# Patient Record
Sex: Male | Born: 1950 | Race: White | Hispanic: No | Marital: Married | Smoking: Never smoker
Health system: Southern US, Community
[De-identification: ages and names within clinical notes are randomized; demographics above are authoritative.]

## PROBLEM LIST (undated history)

## (undated) DIAGNOSIS — Z87442 Personal history of urinary calculi: Secondary | ICD-10-CM

## (undated) DIAGNOSIS — N200 Calculus of kidney: Secondary | ICD-10-CM

## (undated) HISTORY — PX: KNEE SURGERY: SHX244

---

## 2006-10-29 ENCOUNTER — Ambulatory Visit: Payer: Self-pay | Admitting: Orthopedic Surgery

## 2007-11-02 ENCOUNTER — Emergency Department: Payer: Self-pay | Admitting: Emergency Medicine

## 2007-11-11 ENCOUNTER — Emergency Department: Payer: Self-pay | Admitting: Internal Medicine

## 2010-02-05 DIAGNOSIS — D239 Other benign neoplasm of skin, unspecified: Secondary | ICD-10-CM

## 2010-02-05 HISTORY — DX: Other benign neoplasm of skin, unspecified: D23.9

## 2011-01-23 ENCOUNTER — Ambulatory Visit: Payer: Self-pay | Admitting: Urology

## 2011-01-24 ENCOUNTER — Ambulatory Visit: Payer: Self-pay | Admitting: Urology

## 2011-01-25 ENCOUNTER — Observation Stay: Payer: Self-pay | Admitting: Internal Medicine

## 2011-08-06 ENCOUNTER — Emergency Department: Payer: Self-pay | Admitting: Unknown Physician Specialty

## 2011-08-06 LAB — URINALYSIS, COMPLETE
Bilirubin,UR: NEGATIVE
Hyaline Cast: 6
Ketone: NEGATIVE
Nitrite: NEGATIVE
Ph: 5 (ref 4.5–8.0)
Protein: NEGATIVE
Specific Gravity: 1.023 (ref 1.003–1.030)
WBC UR: 7 /HPF (ref 0–5)

## 2011-08-06 LAB — CBC
HGB: 14.4 g/dL (ref 13.0–18.0)
MCHC: 32.9 g/dL (ref 32.0–36.0)
Platelet: 332 10*3/uL (ref 150–440)
RBC: 5 10*6/uL (ref 4.40–5.90)

## 2011-08-06 LAB — COMPREHENSIVE METABOLIC PANEL
Albumin: 4.3 g/dL (ref 3.4–5.0)
Alkaline Phosphatase: 80 U/L (ref 50–136)
BUN: 21 mg/dL — ABNORMAL HIGH (ref 7–18)
Bilirubin,Total: 0.5 mg/dL (ref 0.2–1.0)
Co2: 27 mmol/L (ref 21–32)
Creatinine: 1.13 mg/dL (ref 0.60–1.30)
EGFR (African American): >60
EGFR (Non-African Amer.): >60
SGOT(AST): 30 U/L (ref 15–37)
SGPT (ALT): 37 U/L
Sodium: 141 mmol/L (ref 136–145)
Total Protein: 7.1 g/dL (ref 6.4–8.2)

## 2011-08-06 LAB — TROPONIN I: Troponin-I: <0.02 ng/mL

## 2013-04-30 DIAGNOSIS — D239 Other benign neoplasm of skin, unspecified: Secondary | ICD-10-CM

## 2013-04-30 HISTORY — DX: Other benign neoplasm of skin, unspecified: D23.9

## 2014-08-02 DIAGNOSIS — C4491 Basal cell carcinoma of skin, unspecified: Secondary | ICD-10-CM

## 2014-08-02 HISTORY — DX: Basal cell carcinoma of skin, unspecified: C44.91

## 2014-10-11 ENCOUNTER — Emergency Department
Admission: EM | Admit: 2014-10-11 | Discharge: 2014-10-11 | Disposition: A | Discharge status: Home / Self Care | Payer: No Typology Code available for payment source | Attending: Emergency Medicine | Admitting: Emergency Medicine

## 2014-10-11 ENCOUNTER — Emergency Department: Payer: No Typology Code available for payment source

## 2014-10-11 ENCOUNTER — Encounter: Payer: Self-pay | Admitting: *Deleted

## 2014-10-11 DIAGNOSIS — R109 Unspecified abdominal pain: Secondary | ICD-10-CM | POA: Diagnosis present

## 2014-10-11 DIAGNOSIS — N201 Calculus of ureter: Secondary | ICD-10-CM | POA: Diagnosis not present

## 2014-10-11 LAB — CBC WITH DIFFERENTIAL/PLATELET
BASOS ABS: 0 10*3/uL (ref 0–0.1)
Basophils Relative: 1 %
EOS ABS: 0.1 10*3/uL (ref 0–0.7)
EOS PCT: 1 %
HCT: 47.2 % (ref 40.0–52.0)
Hemoglobin: 15.7 g/dL (ref 13.0–18.0)
Lymphocytes Relative: 8 %
Lymphs Abs: 0.9 10*3/uL — ABNORMAL LOW (ref 1.0–3.6)
MCH: 29.2 pg (ref 26.0–34.0)
MCHC: 33.3 g/dL (ref 32.0–36.0)
MCV: 87.8 fL (ref 80.0–100.0)
Monocytes Absolute: 0.8 10*3/uL (ref 0.2–1.0)
Monocytes Relative: 8 %
Neutro Abs: 8.8 10*3/uL — ABNORMAL HIGH (ref 1.4–6.5)
Neutrophils Relative %: 82 %
PLATELETS: 302 10*3/uL (ref 150–440)
RBC: 5.38 MIL/uL (ref 4.40–5.90)
RDW: 14 % (ref 11.5–14.5)
WBC: 10.6 10*3/uL (ref 3.8–10.6)

## 2014-10-11 LAB — URINALYSIS COMPLETE WITH MICROSCOPIC (ARMC ONLY)
BILIRUBIN URINE: NEGATIVE
Bacteria, UA: NONE SEEN
Glucose, UA: NEGATIVE mg/dL
Ketones, ur: NEGATIVE mg/dL
Leukocytes, UA: NEGATIVE
NITRITE: NEGATIVE
PROTEIN: NEGATIVE mg/dL
SPECIFIC GRAVITY, URINE: 1.018 (ref 1.005–1.030)
pH: 5 (ref 5.0–8.0)

## 2014-10-11 LAB — BASIC METABOLIC PANEL
Anion gap: 9 (ref 5–15)
BUN: 24 mg/dL — AB (ref 6–20)
CO2: 24 mmol/L (ref 22–32)
Calcium: 9.3 mg/dL (ref 8.9–10.3)
Chloride: 104 mmol/L (ref 101–111)
Creatinine, Ser: 1.31 mg/dL — ABNORMAL HIGH (ref 0.61–1.24)
GFR calc Af Amer: >60 mL/min (ref >60)
GFR, EST NON AFRICAN AMERICAN: 57 mL/min — AB (ref >60)
Glucose, Bld: 93 mg/dL (ref 65–99)
POTASSIUM: 4.3 mmol/L (ref 3.5–5.1)
Sodium: 137 mmol/L (ref 135–145)

## 2014-10-11 MED ORDER — KETOROLAC TROMETHAMINE 60 MG/2ML IM SOLN
60.0000 mg | Freq: Once | INTRAMUSCULAR | Status: AC
  Administered 2014-10-11: 60 mg via INTRAMUSCULAR
  Filled 2014-10-11: qty 2

## 2014-10-11 MED ORDER — KETOROLAC TROMETHAMINE 10 MG PO TABS: 10.0000 mg | ORAL_TABLET | Freq: Three times a day (TID) | ORAL | Status: DC | PRN

## 2014-10-11 MED ORDER — OXYCODONE-ACETAMINOPHEN 5-325 MG PO TABS: 1.0000 | ORAL_TABLET | ORAL | Status: DC | PRN

## 2014-10-11 MED ORDER — TAMSULOSIN HCL 0.4 MG PO CAPS: 0.4000 mg | ORAL_CAPSULE | Freq: Every day | ORAL | Status: DC

## 2014-10-11 MED ORDER — ONDANSETRON HCL 4 MG PO TABS: 4.0000 mg | ORAL_TABLET | Freq: Three times a day (TID) | ORAL | Status: DC | PRN

## 2014-10-11 NOTE — ED Provider Notes (Signed)
[facility] Emergency Department Provider Note   ____________________________________________  Time seen: 2:15 PM I have reviewed the triage vital signs and the triage nursing note.  HISTORY  Chief Complaint Flank Pain   Historian Patient  [name] is a 64 y.o. male who has a history of prior kidney stones, who started experiencing intermittent left flank pain last week. Pain is moderate to severe. Currently his pain is 7 out of 10. This feels a prior kidney stone. He's had no hematuria noted. No fever. Moderate nausea. No nausea currently. He's never needed surgical intervention for prior kidney stones.    History reviewed. No pertinent past medical history. Kidney stones  There are no active problems to display for this patient.   History reviewed. No pertinent past surgical history.  Current Outpatient Rx  Name  Route  Sig  Dispense  Refill  . ketorolac (TORADOL) 10 MG tablet   Oral   Take 1 tablet (10 mg total) by mouth every 8 (eight) hours as needed for moderate pain.   15 tablet   0   . ondansetron (ZOFRAN) 4 MG tablet   Oral   Take 1 tablet (4 mg total) by mouth every 8 (eight) hours as needed for nausea or vomiting.   10 tablet   0   . oxyCODONE-acetaminophen (ROXICET) 5-325 MG per tablet   Oral   Take 1 tablet by mouth every 4 (four) hours as needed for severe pain.   10 tablet   0   . tamsulosin (FLOMAX) 0.4 MG CAPS capsule   Oral   Take 1 capsule (0.4 mg total) by mouth daily.   7 capsule   0     Allergies Ciprofloxacin  No family history on file.  Social History Social History  Substance Use Topics  . Smoking status: Never Smoker   . Smokeless tobacco: None  . Alcohol Use: No    Review of Systems  Constitutional: Negative for fever. Eyes: Negative for visual changes. ENT: Negative for sore throat. Cardiovascular: Negative for chest pain. Respiratory: Negative for shortness of  breath. Gastrointestinal: Negative for abdominal pain, vomiting and diarrhea. Genitourinary: Negative for dysuria. Musculoskeletal: Negative for back pain. Skin: Negative for rash. Neurological: Negative for headache. 10 point Review of Systems otherwise negative ____________________________________________   PHYSICAL EXAM:  VITAL SIGNS: ED Triage Vitals  Enc Vitals Group     BP 10/11/14 1143 156/87 mmHg     Pulse Rate 10/11/14 1143 53     Resp 10/11/14 1143 20     Temp 10/11/14 1143 98.2 F (36.8 C)     Temp Source 10/11/14 1143 Oral     SpO2 10/11/14 1143 97 %     Weight 10/11/14 1143 225 lb (102.059 kg)     Height 10/11/14 1143 6\' 3"  (1.905 m)     Head Cir --      Peak Flow --      Pain Score 10/11/14 1144 8     Pain Loc --      Pain Edu? --      Excl. in [place]? --      Constitutional: Alert and oriented. Well appearing and in no distress. Eyes: Conjunctivae are normal. PERRL. Normal extraocular movements. ENT   Head: Normocephalic and atraumatic.   Nose: No congestion/rhinnorhea.   Mouth/Throat: Mucous membranes are moist.   Neck: No stridor. Cardiovascular/Chest: Normal rate, regular rhythm.  No murmurs, rubs, or gallops. Respiratory: Normal respiratory effort without tachypnea nor  retractions. Breath sounds are clear and equal bilaterally. No wheezes/rales/rhonchi. Gastrointestinal: Soft. No distention, no guarding, no rebound. Nontender   Genitourinary/rectal:Deferred Musculoskeletal: Nontender with normal range of motion in all extremities. No joint effusions.  No lower extremity tenderness.  No edema. Neurologic:  Normal speech and language. No gross or focal neurologic deficits are appreciated. Skin:  Skin is warm, dry and intact. No rash noted. Psychiatric: Mood and affect are normal. Speech and behavior are normal. Patient exhibits appropriate insight and judgment.  ____________________________________________   EKG I, Lisa Roca, MD, the  attending physician have personally viewed and interpreted all ECGs.  None ____________________________________________  LABS (pertinent positives/negatives)  Basic metabolic panel significant for BUN 24 and creatinine 1.31 CBC shows white blood cell count 10.6 with a mild left shift otherwise within normal limits Urinalysis too numerous to count red blood cells and all else negative  ____________________________________________  RADIOLOGY All Xrays were viewed by me. Imaging interpreted by Radiologist.  Ultrasound renal:  IMPRESSION: 1. Mild left hydronephrosis. No definite stones identified. Consider CT as needed to evaluate for intrarenal or ureteral stones. 2. Benign right renal cyst. __________________________________________  PROCEDURES  Procedure(s) performed: None  Critical Care performed: None  ____________________________________________   ED COURSE / ASSESSMENT AND PLAN  CONSULTATIONS: None  Pertinent labs & imaging results that were available during my care of the patient were reviewed by me and considered in my medical decision making (see chart for details).   Clinically patient's symptoms are suspicious for ureterolithiasis. Urinalysis is positive for hematuria, and his ultrasound shows mild hydro-on the left which is the same status is critical symptoms. I discussed with patient not obtaining a CT today. We'll treat presumptively for ureterolithiasis.  Patient / Family / Caregiver informed of clinical course, medical decision-making process, and agree with plan.   I discussed return precautions, follow-up instructions, and discharged instructions with patient and/or family.  ___________________________________________   FINAL CLINICAL IMPRESSION(S) / ED DIAGNOSES   Final diagnoses:  Left flank pain  Ureterolithiasis       Lisa Roca, MD 10/11/14 1623

## 2014-10-11 NOTE — Discharge Instructions (Signed)
You were evaluated for left flank pain and found to have blood in your urine consistent with a kidney stone clinically. Return to the emergency department for any worsening pain, inability to urinate, fever, nausea, dizziness or passing out, abdominal pain, or any other symptoms concerning to you.  If you have not pass the stone within 1 week, make an appointment with a urologist.   Kidney Stones Kidney stones (urolithiasis) are deposits that form inside your kidneys. The intense pain is caused by the stone moving through the urinary tract. When the stone moves, the ureter goes into spasm around the stone. The stone is usually passed in the urine.  CAUSES   A disorder that makes certain neck glands produce too much parathyroid hormone (primary hyperparathyroidism).  A buildup of uric acid crystals, similar to gout in your joints.  Narrowing (stricture) of the ureter.  A kidney obstruction present at birth (congenital obstruction).  Previous surgery on the kidney or ureters.  Numerous kidney infections. SYMPTOMS   Feeling sick to your stomach (nauseous).  Throwing up (vomiting).  Blood in the urine (hematuria).  Pain that usually spreads (radiates) to the groin.  Frequency or urgency of urination. DIAGNOSIS   Taking a history and physical exam.  Blood or urine tests.  CT scan.  Occasionally, an examination of the inside of the urinary bladder (cystoscopy) is performed. TREATMENT   Observation.  Increasing your fluid intake.  Extracorporeal shock wave lithotripsy--This is a noninvasive procedure that uses shock waves to break up kidney stones.  Surgery may be needed if you have severe pain or persistent obstruction. There are various surgical procedures. Most of the procedures are performed with the use of small instruments. Only small incisions are needed to accommodate these instruments, so recovery time is minimized. The size, location, and chemical composition are  all important variables that will determine the proper choice of action for you. Talk to your health care provider to better understand your situation so that you will minimize the risk of injury to yourself and your kidney.  HOME CARE INSTRUCTIONS   Drink enough water and fluids to keep your urine clear or pale yellow. This will help you to pass the stone or stone fragments.  Strain all urine through the provided strainer. Keep all particulate matter and stones for your health care provider to see. The stone causing the pain may be as small as a grain of salt. It is very important to use the strainer each and every time you pass your urine. The collection of your stone will allow your health care provider to analyze it and verify that a stone has actually passed. The stone analysis will often identify what you can do to reduce the incidence of recurrences.  Only take over-the-counter or prescription medicines for pain, discomfort, or fever as directed by your health care provider.  Make a follow-up appointment with your health care provider as directed.  Get follow-up X-rays if required. The absence of pain does not always mean that the stone has passed. It may have only stopped moving. If the urine remains completely obstructed, it can cause loss of kidney function or even complete destruction of the kidney. It is your responsibility to make sure X-rays and follow-ups are completed. Ultrasounds of the kidney can show blockages and the status of the kidney. Ultrasounds are not associated with any radiation and can be performed easily in a matter of minutes. SEEK MEDICAL CARE IF:  You experience pain that is progressive  and unresponsive to any pain medicine you have been prescribed. SEEK IMMEDIATE MEDICAL CARE IF:   Pain cannot be controlled with the prescribed medicine.  You have a fever or shaking chills.  The severity or intensity of pain increases over 18 hours and is not relieved by pain  medicine.  You develop a new onset of abdominal pain.  You feel faint or pass out.  You are unable to urinate. MAKE SURE YOU:   Understand these instructions.  Will watch your condition.  Will get help right away if you are not doing well or get worse. Document Released: 01/22/2005 Document Revised: 09/24/2012 Document Reviewed: 06/25/2012 [facility] Patient Information 2015 [place], [place]. This information is not intended to replace advice given to you by your health care provider. Make sure you discuss any questions you have with your health care provider.

## 2014-10-11 NOTE — ED Notes (Signed)
Pt has a history of kidney stones , pt complains of left flank pain, nausea

## 2014-10-11 NOTE — ED Notes (Signed)
Patient transported to Ultrasound 

## 2014-10-11 NOTE — ED Notes (Signed)
Pt reports kidney stones, pain since Friday worsening this am. Pt reports pain is on the left side. Reports hx of the same, feels the same. C/o nausea as well. Denies blood in urine or inability to urinate.

## 2015-07-27 ENCOUNTER — Emergency Department
Admission: EM | Admit: 2015-07-27 | Discharge: 2015-07-27 | Disposition: A | Discharge status: Home / Self Care | Payer: BLUE CROSS/BLUE SHIELD | Attending: Emergency Medicine | Admitting: Emergency Medicine

## 2015-07-27 ENCOUNTER — Encounter: Payer: Self-pay | Admitting: Emergency Medicine

## 2015-07-27 ENCOUNTER — Emergency Department: Payer: BLUE CROSS/BLUE SHIELD

## 2015-07-27 DIAGNOSIS — R109 Unspecified abdominal pain: Secondary | ICD-10-CM

## 2015-07-27 DIAGNOSIS — N201 Calculus of ureter: Secondary | ICD-10-CM | POA: Diagnosis not present

## 2015-07-27 DIAGNOSIS — R319 Hematuria, unspecified: Secondary | ICD-10-CM

## 2015-07-27 DIAGNOSIS — R1031 Right lower quadrant pain: Secondary | ICD-10-CM | POA: Diagnosis present

## 2015-07-27 HISTORY — DX: Calculus of kidney: N20.0

## 2015-07-27 LAB — URINALYSIS COMPLETE WITH MICROSCOPIC (ARMC ONLY)
Bilirubin Urine: NEGATIVE
Glucose, UA: NEGATIVE mg/dL
KETONES UR: NEGATIVE mg/dL
Leukocytes, UA: NEGATIVE
Nitrite: NEGATIVE
PH: 5 (ref 5.0–8.0)
PROTEIN: NEGATIVE mg/dL
SQUAMOUS EPITHELIAL / LPF: NONE SEEN
Specific Gravity, Urine: 1.019 (ref 1.005–1.030)

## 2015-07-27 LAB — BASIC METABOLIC PANEL
ANION GAP: 8 (ref 5–15)
BUN: 23 mg/dL — ABNORMAL HIGH (ref 6–20)
CALCIUM: 9.6 mg/dL (ref 8.9–10.3)
CO2: 23 mmol/L (ref 22–32)
Chloride: 108 mmol/L (ref 101–111)
Creatinine, Ser: 1.36 mg/dL — ABNORMAL HIGH (ref 0.61–1.24)
GFR, EST NON AFRICAN AMERICAN: 54 mL/min — AB (ref >60)
Glucose, Bld: 107 mg/dL — ABNORMAL HIGH (ref 65–99)
Potassium: 4.1 mmol/L (ref 3.5–5.1)
Sodium: 139 mmol/L (ref 135–145)

## 2015-07-27 LAB — CBC
HCT: 44.2 % (ref 40.0–52.0)
HEMOGLOBIN: 15.2 g/dL (ref 13.0–18.0)
MCH: 29.1 pg (ref 26.0–34.0)
MCHC: 34.3 g/dL (ref 32.0–36.0)
MCV: 84.7 fL (ref 80.0–100.0)
Platelets: 334 10*3/uL (ref 150–440)
RBC: 5.22 MIL/uL (ref 4.40–5.90)
RDW: 14.9 % — ABNORMAL HIGH (ref 11.5–14.5)
WBC: 9.9 10*3/uL (ref 3.8–10.6)

## 2015-07-27 MED ORDER — TAMSULOSIN HCL 0.4 MG PO CAPS: 0.4000 mg | ORAL_CAPSULE | Freq: Every day | ORAL | Status: DC

## 2015-07-27 MED ORDER — ONDANSETRON 4 MG PO TBDP
8.0000 mg | ORAL_TABLET | Freq: Once | ORAL | Status: AC
  Administered 2015-07-27: 8 mg via ORAL
  Filled 2015-07-27: qty 2

## 2015-07-27 MED ORDER — OXYCODONE-ACETAMINOPHEN 5-325 MG PO TABS: 1.0000 | ORAL_TABLET | Freq: Four times a day (QID) | ORAL | Status: DC | PRN

## 2015-07-27 MED ORDER — ONDANSETRON 4 MG PO TBDP: 4.0000 mg | ORAL_TABLET | Freq: Three times a day (TID) | ORAL | Status: DC | PRN

## 2015-07-27 MED ORDER — KETOROLAC TROMETHAMINE 30 MG/ML IJ SOLN
15.0000 mg | Freq: Once | INTRAMUSCULAR | Status: AC
  Administered 2015-07-27: 15 mg via INTRAMUSCULAR
  Filled 2015-07-27: qty 1

## 2015-07-27 MED ORDER — NAPROXEN 500 MG PO TABS: 500.0000 mg | ORAL_TABLET | Freq: Two times a day (BID) | ORAL | Status: DC

## 2015-07-27 NOTE — ED Provider Notes (Signed)
[facility] Emergency Department Provider Note  ____________________________________________  Time seen: 3:10 PM  I have reviewed the triage vital signs and the nursing notes.   HISTORY  Chief Complaint Flank Pain    [name] is a 65 y.o. male who complains of severe right flank pain radiating to the right lower quadrant that started earlier today, colicky. Feels like prior kidney stones. No aggravating or alleviating factors. Associated with nausea but no vomiting diarrhea fevers or chills. No chest pain or shortness of breath.     Past Medical History  Diagnosis Date  . Kidney stones      There are no active problems to display for this patient.    Past Surgical History  Procedure Laterality Date  . Knee surgery       Current Outpatient Rx  Name  Route  Sig  Dispense  Refill  . naproxen (NAPROSYN) 500 MG tablet   Oral   Take 1 tablet (500 mg total) by mouth 2 (two) times daily with a meal.   20 tablet   0   . ondansetron (ZOFRAN ODT) 4 MG disintegrating tablet   Oral   Take 1 tablet (4 mg total) by mouth every 8 (eight) hours as needed for nausea or vomiting.   20 tablet   0   . oxyCODONE-acetaminophen (ROXICET) 5-325 MG tablet   Oral   Take 1 tablet by mouth every 6 (six) hours as needed for severe pain.   12 tablet   0   . tamsulosin (FLOMAX) 0.4 MG CAPS capsule   Oral   Take 1 capsule (0.4 mg total) by mouth daily.   30 capsule   0      Allergies Ciprofloxacin   No family history on file.  Social History Social History  Substance Use Topics  . Smoking status: Never Smoker   . Smokeless tobacco: None  . Alcohol Use: No    Review of Systems  Constitutional:   No fever or chills.  Eyes:   No vision changes.  ENT:   No sore throat. No rhinorrhea. Cardiovascular:   No chest pain. Respiratory:   No dyspnea or cough. Gastrointestinal:   Right flank pain without vomiting or diarrhea.   Genitourinary:   Negative for dysuria or difficulty urinating. Musculoskeletal:   Negative for focal pain or swelling Neurological:   Negative for headaches 10-point ROS otherwise negative.  ____________________________________________   PHYSICAL EXAM:  VITAL SIGNS: ED Triage Vitals  Enc Vitals Group     BP 07/27/15 1355 162/76 mmHg     Pulse Rate 07/27/15 1355 50     Resp 07/27/15 1355 18     Temp 07/27/15 1355 97.8 F (36.6 C)     Temp Source 07/27/15 1355 Oral     SpO2 07/27/15 1355 97 %     Weight 07/27/15 1355 250 lb (113.399 kg)     Height 07/27/15 1355 6\' 3"  (1.905 m)     Head Cir --      Peak Flow --      Pain Score 07/27/15 1401 8     Pain Loc --      Pain Edu? --      Excl. in [place]? --     Vital signs reviewed, nursing assessments reviewed.   Constitutional:   Alert and oriented. Well appearing and in no distress. Eyes:   No scleral icterus. No conjunctival pallor. PERRL. EOMI.  No nystagmus. ENT   Head:  Normocephalic and atraumatic.   Nose:   No congestion/rhinnorhea. No septal hematoma   Mouth/Throat:   MMM, no pharyngeal erythema. No peritonsillar mass.    Neck:   No stridor. No SubQ emphysema. No meningismus. Hematological/Lymphatic/Immunilogical:   No cervical lymphadenopathy. Cardiovascular:   RRR. Symmetric bilateral radial and DP pulses.  No murmurs.  Respiratory:   Normal respiratory effort without tachypnea nor retractions. Breath sounds are clear and equal bilaterally. No wheezes/rales/rhonchi. Gastrointestinal:   Soft With right lower quadrant tenderness. Non distended. There is Right CVA tenderness.  No rebound, rigidity, or guarding. Genitourinary:   deferred Musculoskeletal:   Nontender with normal range of motion in all extremities. No joint effusions.  No lower extremity tenderness.  No edema. Neurologic:   Normal speech and language.  CN 2-10 normal. Motor grossly intact. No gross focal neurologic deficits are appreciated.   Skin:    Skin is warm, dry and intact. No rash noted.  No petechiae, purpura, or bullae.  ____________________________________________    LABS (pertinent positives/negatives) (all labs ordered are listed, but only abnormal results are displayed) Labs Reviewed  URINALYSIS COMPLETEWITH MICROSCOPIC ([place]) - Abnormal; Notable for the following:    Color, Urine YELLOW (*)    APPearance CLEAR (*)    Hgb urine dipstick 3+ (*)    Bacteria, UA RARE (*)    All other components within normal limits  BASIC METABOLIC PANEL - Abnormal; Notable for the following:    Glucose, Bld 107 (*)    BUN 23 (*)    Creatinine, Ser 1.36 (*)    GFR calc non Af Amer 54 (*)    All other components within normal limits  CBC - Abnormal; Notable for the following:    RDW 14.9 (*)    All other components within normal limits   ____________________________________________   EKG    ____________________________________________    RADIOLOGY  CT abdomen and pelvis reveals right ureterolithiasis 5 mm stone with mild hydronephrosis.  ____________________________________________   PROCEDURES   ____________________________________________   INITIAL IMPRESSION / ASSESSMENT AND PLAN / ED COURSE  Pertinent labs & imaging results that were available during my care of the patient were reviewed by me and considered in my medical decision making (see chart for details).  Patient presents with right flank pain and hematuria, pain control after Toradol and Zofran. CT reveals 5 mm stone in the ureter with some mild nephrolithiasis. Creatinine is at baseline. No evidence of urinary tract infection. Follow-up with primary care and urology. We'll prescribe NSAID Percocet Flomax and Zofran.     ____________________________________________   FINAL CLINICAL IMPRESSION(S) / ED DIAGNOSES  Final diagnoses:  Ureterolithiasis  Hematuria  Flank pain       Portions of this note were generated with dragon  dictation software. Dictation errors may occur despite best attempts at proofreading.   Carrie Mew, MD 07/27/15 1729

## 2015-07-27 NOTE — ED Notes (Signed)
Pt with right flank pain that started earlier today.  Pt with history of kidney stones and states "this feels like kidney stone".  Pt with some nausea.

## 2015-07-27 NOTE — Discharge Instructions (Signed)
You were prescribed a medication that is potentially sedating. Do not drink alcohol, drive or participate in any other potentially dangerous activities while taking this medication as it may make you sleepy. Do not take this medication with any other sedating medications, either prescription or over-the-counter. If you were prescribed Percocet or Vicodin, do not take these with acetaminophen (Tylenol) as it is already contained within these medications.   Opioid pain medications (or "narcotics") can be habit forming.  Use it as little as possible to achieve adequate pain control.  Do not use or use it with extreme caution if you have a history of opiate abuse or dependence.  If you are on a pain contract with your primary care doctor or a pain specialist, be sure to let them know you were prescribed this medication today from the [facility].  This medication is intended for your use only - do not give any to anyone else and keep it in a secure place where nobody else, especially children and pets, have access to it.  It will also cause or worsen constipation, so you may want to consider taking an over-the-counter stool softener while you are taking this medication.  Abdominal Pain, Adult Many things can cause abdominal pain. Usually, abdominal pain is not caused by a disease and will improve without treatment. It can often be observed and treated at home. Your health care provider will do a physical exam and possibly order blood tests and X-rays to help determine the seriousness of your pain. However, in many cases, more time must pass before a clear cause of the pain can be found. Before that point, your health care provider may not know if you need more testing or further treatment. HOME CARE INSTRUCTIONS Monitor your abdominal pain for any changes. The following actions may help to alleviate any discomfort you are experiencing:  Only take over-the-counter or prescription  medicines as directed by your health care provider.  Do not take laxatives unless directed to do so by your health care provider.  Try a clear liquid diet (broth, tea, or water) as directed by your health care provider. Slowly move to a bland diet as tolerated. SEEK MEDICAL CARE IF:  You have unexplained abdominal pain.  You have abdominal pain associated with nausea or diarrhea.  You have pain when you urinate or have a bowel movement.  You experience abdominal pain that wakes you in the night.  You have abdominal pain that is worsened or improved by eating food.  You have abdominal pain that is worsened with eating fatty foods.  You have a fever. SEEK IMMEDIATE MEDICAL CARE IF:  Your pain does not go away within 2 hours.  You keep throwing up (vomiting).  Your pain is felt only in portions of the abdomen, such as the right side or the left lower portion of the abdomen.  You pass bloody or black tarry stools. MAKE SURE YOU:  Understand these instructions.  Will watch your condition.  Will get help right away if you are not doing well or get worse.   This information is not intended to replace advice given to you by your health care provider. Make sure you discuss any questions you have with your health care provider.   Document Released: 11/01/2004 Document Revised: 10/13/2014 Document Reviewed: 10/01/2012 Elsevier Interactive Patient Education 2016 [employer]  Hematuria, Adult Hematuria is blood in your urine. It can be caused by a bladder infection, kidney infection, prostate infection, kidney  stone, or cancer of your urinary tract. Infections can usually be treated with medicine, and a kidney stone usually will pass through your urine. If neither of these is the cause of your hematuria, further workup to find out the reason may be needed. It is very important that you tell your health care provider about any blood you see in your urine, even if the blood stops  without treatment or happens without causing pain. Blood in your urine that happens and then stops and then happens again can be a symptom of a very serious condition. Also, pain is not a symptom in the initial stages of many urinary cancers. HOME CARE INSTRUCTIONS   Drink lots of fluid, 3-4 quarts a day. If you have been diagnosed with an infection, cranberry juice is especially recommended, in addition to large amounts of water.  Avoid caffeine, tea, and carbonated beverages because they tend to irritate the bladder.  Avoid alcohol because it may irritate the prostate.  Take all medicines as directed by your health care provider.  If you were prescribed an antibiotic medicine, finish it all even if you start to feel better.  If you have been diagnosed with a kidney stone, follow your health care provider's instructions regarding straining your urine to catch the stone.  Empty your bladder often. Avoid holding urine for long periods of time.  After a bowel movement, women should cleanse front to back. Use each tissue only once.  Empty your bladder before and after sexual intercourse if you are a male. SEEK MEDICAL CARE IF:  You develop back pain.  You have a fever.  You have a feeling of sickness in your stomach (nausea) or vomiting.  Your symptoms are not better in 3 days. Return sooner if you are getting worse. SEEK IMMEDIATE MEDICAL CARE IF:   You develop severe vomiting and are unable to keep the medicine down.  You develop severe back or abdominal pain despite taking your medicines.  You begin passing a large amount of blood or clots in your urine.  You feel extremely weak or faint, or you pass out. MAKE SURE YOU:   Understand these instructions.  Will watch your condition.  Will get help right away if you are not doing well or get worse.   This information is not intended to replace advice given to you by your health care provider. Make sure you discuss any  questions you have with your health care provider.   Document Released: 01/22/2005 Document Revised: 02/12/2014 Document Reviewed: 09/22/2012 Elsevier Interactive Patient Education 2016 [employer].  Kidney Stones Kidney stones (urolithiasis) are deposits that form inside your kidneys. The intense pain is caused by the stone moving through the urinary tract. When the stone moves, the ureter goes into spasm around the stone. The stone is usually passed in the urine.  CAUSES   A disorder that makes certain neck glands produce too much parathyroid hormone (primary hyperparathyroidism).  A buildup of uric acid crystals, similar to gout in your joints.  Narrowing (stricture) of the ureter.  A kidney obstruction present at birth (congenital obstruction).  Previous surgery on the kidney or ureters.  Numerous kidney infections. SYMPTOMS   Feeling sick to your stomach (nauseous).  Throwing up (vomiting).  Blood in the urine (hematuria).  Pain that usually spreads (radiates) to the groin.  Frequency or urgency of urination. DIAGNOSIS   Taking a history and physical exam.  Blood or urine tests.  CT scan.  Occasionally, an examination  of the inside of the urinary bladder (cystoscopy) is performed. TREATMENT   Observation.  Increasing your fluid intake.  Extracorporeal shock wave lithotripsy--This is a noninvasive procedure that uses shock waves to break up kidney stones.  Surgery may be needed if you have severe pain or persistent obstruction. There are various surgical procedures. Most of the procedures are performed with the use of small instruments. Only small incisions are needed to accommodate these instruments, so recovery time is minimized. The size, location, and chemical composition are all important variables that will determine the proper choice of action for you. Talk to your health care provider to better understand your situation so that you will minimize the risk  of injury to yourself and your kidney.  HOME CARE INSTRUCTIONS   Drink enough water and fluids to keep your urine clear or pale yellow. This will help you to pass the stone or stone fragments.  Strain all urine through the provided strainer. Keep all particulate matter and stones for your health care provider to see. The stone causing the pain may be as small as a grain of salt. It is very important to use the strainer each and every time you pass your urine. The collection of your stone will allow your health care provider to analyze it and verify that a stone has actually passed. The stone analysis will often identify what you can do to reduce the incidence of recurrences.  Only take over-the-counter or prescription medicines for pain, discomfort, or fever as directed by your health care provider.  Keep all follow-up visits as told by your health care provider. This is important.  Get follow-up X-rays if required. The absence of pain does not always mean that the stone has passed. It may have only stopped moving. If the urine remains completely obstructed, it can cause loss of kidney function or even complete destruction of the kidney. It is your responsibility to make sure X-rays and follow-ups are completed. Ultrasounds of the kidney can show blockages and the status of the kidney. Ultrasounds are not associated with any radiation and can be performed easily in a matter of minutes.  Make changes to your daily diet as told by your health care provider. You may be told to:  Limit the amount of salt that you eat.  Eat 5 or more servings of fruits and vegetables each day.  Limit the amount of meat, poultry, fish, and eggs that you eat.  Collect a 24-hour urine sample as told by your health care provider.You may need to collect another urine sample every 6-12 months. SEEK MEDICAL CARE IF:  You experience pain that is progressive and unresponsive to any pain medicine you have been  prescribed. SEEK IMMEDIATE MEDICAL CARE IF:   Pain cannot be controlled with the prescribed medicine.  You have a fever or shaking chills.  The severity or intensity of pain increases over 18 hours and is not relieved by pain medicine.  You develop a new onset of abdominal pain.  You feel faint or pass out.  You are unable to urinate.   This information is not intended to replace advice given to you by your health care provider. Make sure you discuss any questions you have with your health care provider.   Document Released: 01/22/2005 Document Revised: 10/13/2014 Document Reviewed: 06/25/2012 Elsevier Interactive Patient Education [employer].

## 2016-09-17 IMAGING — CT CT RENAL STONE PROTOCOL
3 of 4 series · 8 of 46 positions shown, 15 images · non-contrast
Comparison: 10/11/2014 renal ultrasound.  Prior CT of 01/24/2011.

CLINICAL DATA: Right-sided flank pain. History kidney stones.
Nausea. Hematuria.

EXAM:
CT ABDOMEN AND PELVIS WITHOUT CONTRAST
TECHNIQUE: Multidetector CT imaging of the abdomen and pelvis was performed
following the standard protocol without IV contrast.

[Series 4: lung · axial · 0.83mm/px · z∈[-212,-152]mm · 4 of 22 slices shown, 9 images]
[im 5/22  soft-tissue]
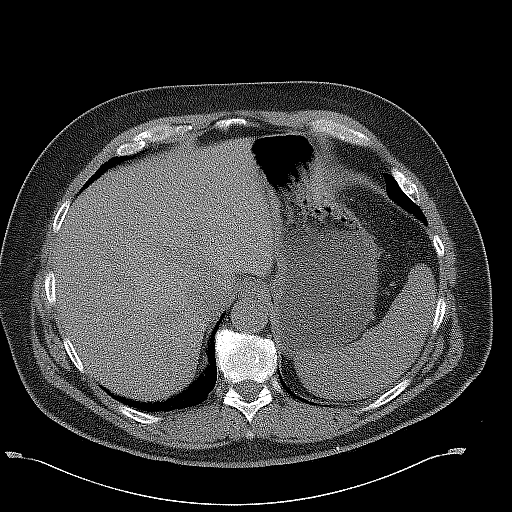
[im 5/22  lung]
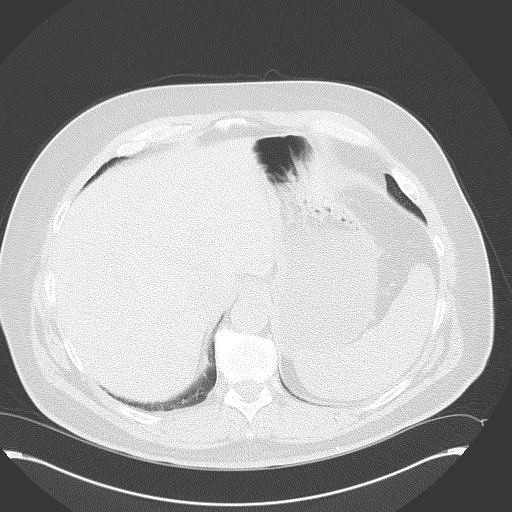
[im 5/22  bone]
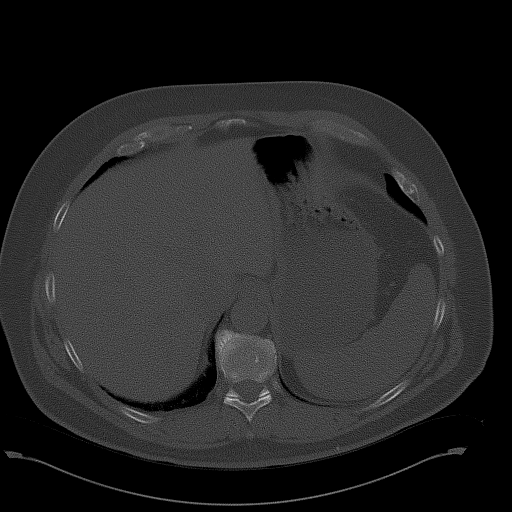
[im 9/22  soft-tissue]
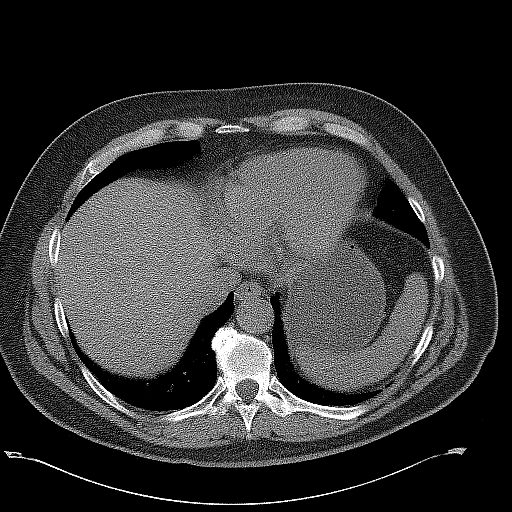
[im 9/22  lung]
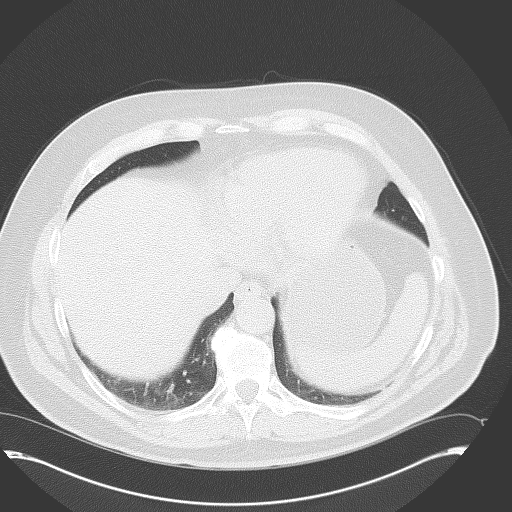
[im 13/22  soft-tissue]
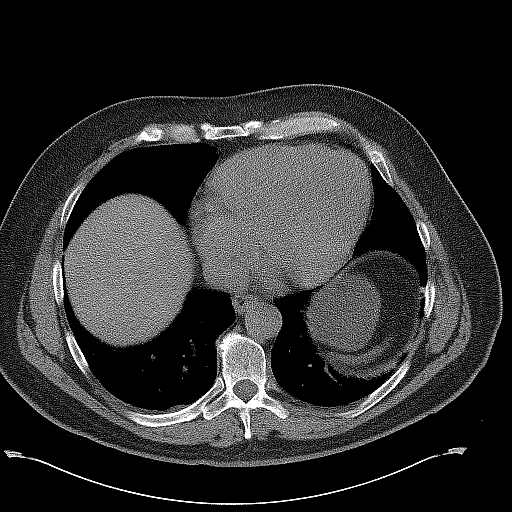
[im 13/22  lung]
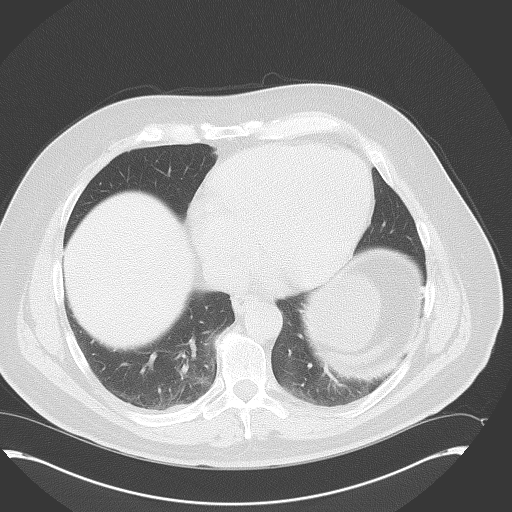
[im 17/22  soft-tissue]
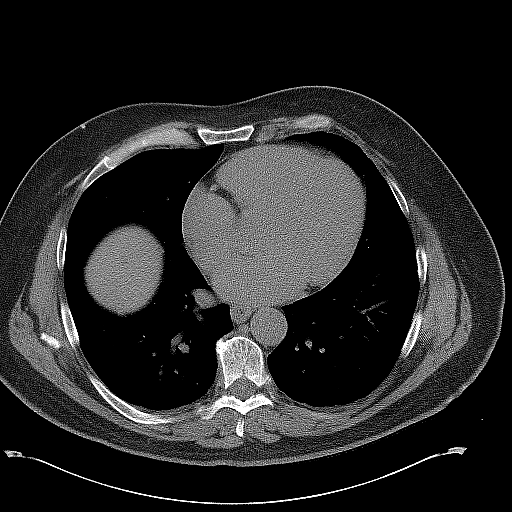
[im 17/22  lung]
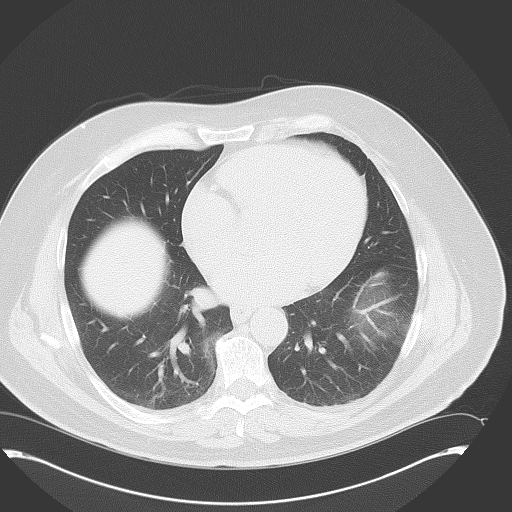

[Series 5: coronal · coronal · 0.83mm/px · 3 of 160 slices shown, 4 images]
[im 54/160  soft-tissue]
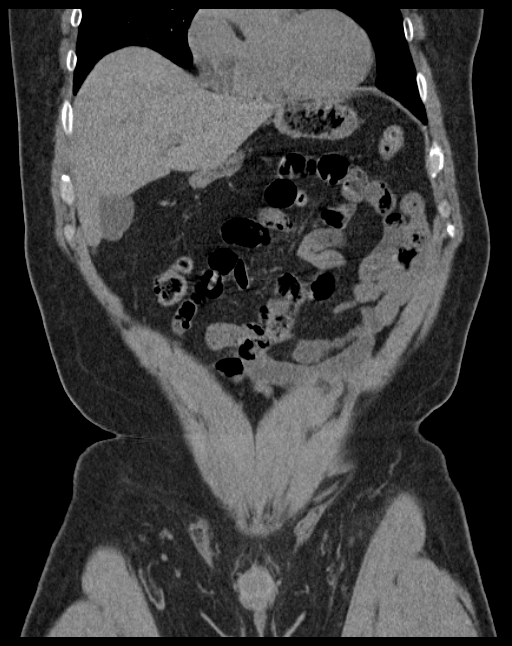
[im 71/160  soft-tissue]
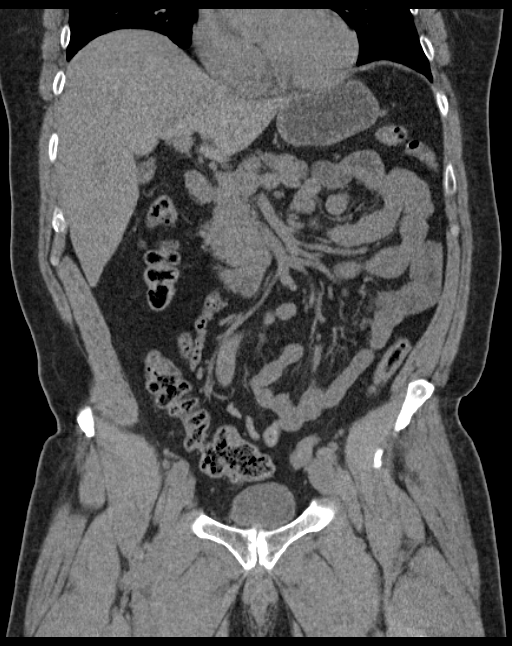
[im 71/160  bone]
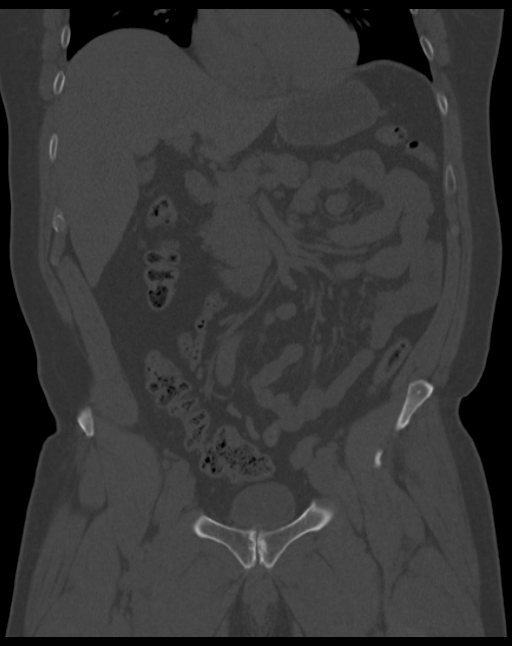
[im 89/160  soft-tissue]
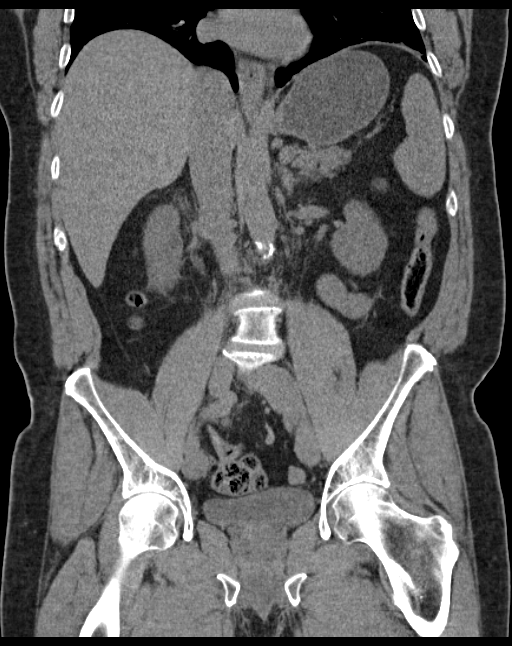

[Series 6: sagittal · sagittal · 0.64mm/px · 1 of 205 slices shown, 2 images]
[im 69/205  soft-tissue]
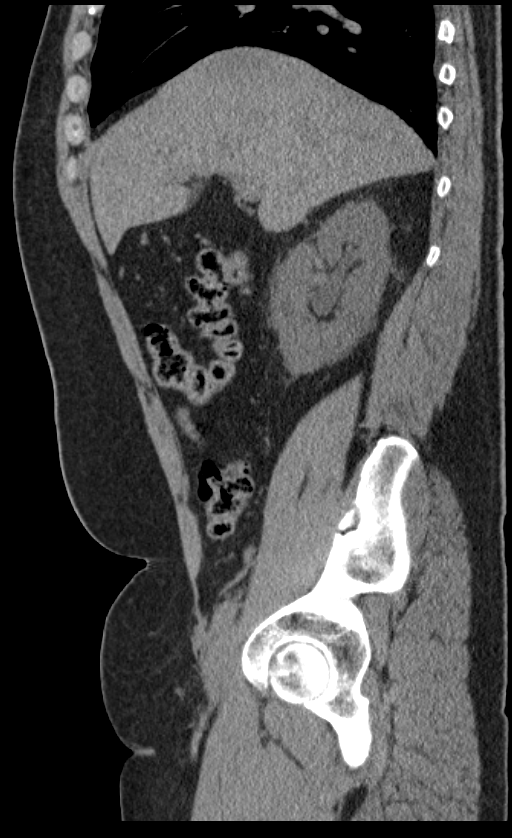
[im 69/205  bone]
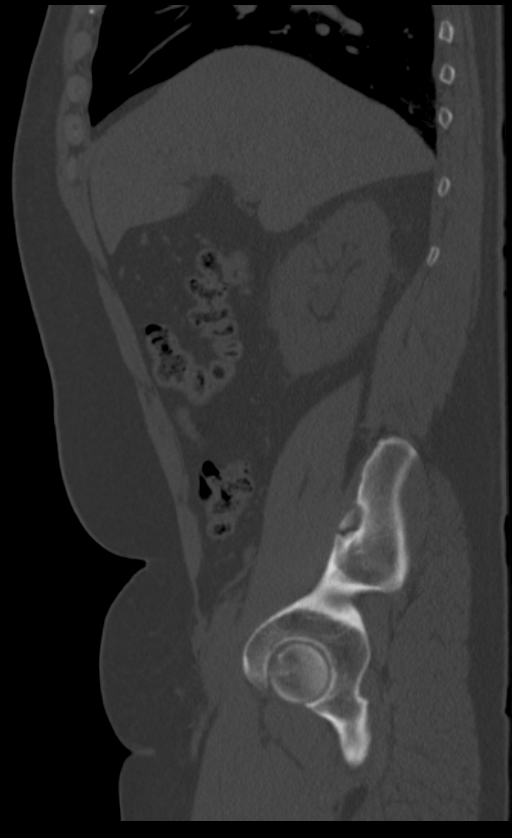

[8 of 46 positions shown; findings below may reference images not displayed]

FINDINGS: Lower chest: Bibasilar dependent atelectasis. Borderline
cardiomegaly, without pericardial or pleural effusion. Lad and right
coronary artery atherosclerosis.

Hepatobiliary: High right hepatic lobe cyst of 9 mm. Suspect small
gallstones. No biliary duct dilatation or acute cholecystitis.

Pancreas: Normal, without mass or ductal dilatation.

Spleen: Normal in size, without focal abnormality.

Adrenals/Urinary Tract: Normal adrenal glands. An interpolar right
renal 14 mm lesion is likely a cyst. 4 mm upper pole right renal
collecting system stone. Mild right-sided hydroureteronephrosis to
the level of a mid to distal right ureteric stone which measures 5
mm. No bladder calculi.

Stomach/Bowel: Normal stomach, without wall thickening. Scattered
colonic diverticula. Normal terminal ileum and appendix. Normal
small bowel.

Vascular/Lymphatic: Aortic and branch vessel atherosclerosis. No
abdominopelvic adenopathy.

Reproductive: Normal prostate.

Other: Trace cul-de-sac fluid

Musculoskeletal: Degenerate disc disease at the lumbosacral
junction.
IMPRESSION: 1. Right-sided urinary tract obstruction secondary to a mid to
distal right ureteric stone.
2. Right nephrolithiasis.
3.  Coronary artery atherosclerosis. Aortic atherosclerosis.
4. Trace cul-de-sac fluid, of indeterminate etiology.

## 2018-07-28 ENCOUNTER — Other Ambulatory Visit: Payer: Self-pay

## 2018-07-28 ENCOUNTER — Other Ambulatory Visit
Admission: RE | Admit: 2018-07-28 | Discharge: 2018-07-28 | Disposition: A | Discharge status: Home / Self Care | Payer: Medicare HMO | Source: Ambulatory Visit | Attending: Internal Medicine | Admitting: Internal Medicine

## 2018-07-28 DIAGNOSIS — Z1159 Encounter for screening for other viral diseases: Secondary | ICD-10-CM | POA: Diagnosis present

## 2018-07-29 LAB — NOVEL CORONAVIRUS, NAA (HOSP ORDER, SEND-OUT TO REF LAB; TAT 18-24 HRS): SARS-CoV-2, NAA: NOT DETECTED

## 2018-07-31 ENCOUNTER — Ambulatory Visit: Payer: Medicare HMO | Admitting: Anesthesiology

## 2018-07-31 ENCOUNTER — Ambulatory Visit
Admission: RE | Admit: 2018-07-31 | Discharge: 2018-07-31 | Disposition: A | Discharge status: Home / Self Care | Payer: Medicare HMO | Attending: Internal Medicine | Admitting: Internal Medicine

## 2018-07-31 ENCOUNTER — Encounter
Admission: RE | Disposition: A | Discharge status: Home / Self Care | Payer: Self-pay | Source: Home / Self Care | Attending: Internal Medicine

## 2018-07-31 ENCOUNTER — Other Ambulatory Visit: Payer: Self-pay

## 2018-07-31 DIAGNOSIS — K219 Gastro-esophageal reflux disease without esophagitis: Secondary | ICD-10-CM | POA: Insufficient documentation

## 2018-07-31 DIAGNOSIS — E785 Hyperlipidemia, unspecified: Secondary | ICD-10-CM | POA: Insufficient documentation

## 2018-07-31 DIAGNOSIS — I480 Paroxysmal atrial fibrillation: Secondary | ICD-10-CM | POA: Diagnosis not present

## 2018-07-31 DIAGNOSIS — Z800 Family history of malignant neoplasm of digestive organs: Secondary | ICD-10-CM | POA: Insufficient documentation

## 2018-07-31 DIAGNOSIS — Z881 Allergy status to other antibiotic agents status: Secondary | ICD-10-CM | POA: Diagnosis not present

## 2018-07-31 DIAGNOSIS — Z87891 Personal history of nicotine dependence: Secondary | ICD-10-CM | POA: Insufficient documentation

## 2018-07-31 DIAGNOSIS — Z86010 Personal history of colonic polyps: Secondary | ICD-10-CM | POA: Insufficient documentation

## 2018-07-31 DIAGNOSIS — I10 Essential (primary) hypertension: Secondary | ICD-10-CM | POA: Diagnosis not present

## 2018-07-31 DIAGNOSIS — Z8042 Family history of malignant neoplasm of prostate: Secondary | ICD-10-CM | POA: Insufficient documentation

## 2018-07-31 DIAGNOSIS — Z79899 Other long term (current) drug therapy: Secondary | ICD-10-CM | POA: Diagnosis not present

## 2018-07-31 DIAGNOSIS — Z8371 Family history of colonic polyps: Secondary | ICD-10-CM | POA: Insufficient documentation

## 2018-07-31 DIAGNOSIS — I4891 Unspecified atrial fibrillation: Secondary | ICD-10-CM | POA: Diagnosis present

## 2018-07-31 DIAGNOSIS — Z8249 Family history of ischemic heart disease and other diseases of the circulatory system: Secondary | ICD-10-CM | POA: Diagnosis not present

## 2018-07-31 HISTORY — DX: Personal history of urinary calculi: Z87.442

## 2018-07-31 HISTORY — PX: CARDIOVERSION: SHX1299

## 2018-07-31 SURGERY — CARDIOVERSION
Anesthesia: General

## 2018-07-31 MED ORDER — PROPOFOL 10 MG/ML IV BOLUS
INTRAVENOUS | Status: DC | PRN
  Administered 2018-07-31: 50 mg via INTRAVENOUS
  Administered 2018-07-31: 30 mg via INTRAVENOUS

## 2018-07-31 MED ORDER — METOPROLOL TARTRATE 25 MG PO TABS: 25.0000 mg | ORAL_TABLET | Freq: Two times a day (BID) | ORAL | 4 refills | Status: DC

## 2018-07-31 MED ORDER — PROPOFOL 500 MG/50ML IV EMUL
INTRAVENOUS | Status: AC
  Filled 2018-07-31: qty 50

## 2018-07-31 MED ORDER — SODIUM CHLORIDE 0.9 % IV SOLN
INTRAVENOUS | Status: DC | PRN
  Administered 2018-07-31: 07:00:00 via INTRAVENOUS

## 2018-07-31 NOTE — Anesthesia Post-op Follow-up Note (Signed)
Anesthesia QCDR form completed.        

## 2018-07-31 NOTE — Transfer of Care (Signed)
Immediate Anesthesia Transfer of Care Note  Patient: Anthony Colon  Procedure(s) Performed: CARDIOVERSION (N/A )  Patient Location: PACU  Anesthesia Type:General  Level of Consciousness: awake and sedated  Airway & Oxygen Therapy: Patient Spontanous Breathing and Patient connected to nasal cannula oxygen  Post-op Assessment: Report given to RN and Post -op Vital signs reviewed and stable  Post vital signs: Reviewed and stable  Last Vitals:  Vitals Value Taken Time  BP    Temp    Pulse 46 07/31/18 0744  Resp 23 07/31/18 0744  SpO2 95 % 07/31/18 0744  Vitals shown include unvalidated device data.  Last Pain:  Vitals:   07/31/18 0648  TempSrc: Oral  PainSc: 0-No pain         Complications: No apparent anesthesia complications

## 2018-07-31 NOTE — Anesthesia Postprocedure Evaluation (Signed)
Anesthesia Post Note  Patient: MOHIT ZIRBES  Procedure(s) Performed: CARDIOVERSION (N/A )  Patient location during evaluation: Cath Lab Anesthesia Type: General Level of consciousness: awake and alert Pain management: pain level controlled Vital Signs Assessment: post-procedure vital signs reviewed and stable Respiratory status: spontaneous breathing, nonlabored ventilation, respiratory function stable and patient connected to nasal cannula oxygen Cardiovascular status: blood pressure returned to baseline and stable Postop Assessment: no apparent nausea or vomiting Anesthetic complications: no     Last Vitals:  Vitals:   07/31/18 0800 07/31/18 0815  BP: 107/65 105/67  Pulse: (!) 46 (!) 43  Resp: (!) 21 16  Temp:    SpO2: 97% 97%    Last Pain:  Vitals:   07/31/18 0648  TempSrc: Oral  PainSc: 0-No pain                 Martha Clan

## 2018-07-31 NOTE — Anesthesia Preprocedure Evaluation (Signed)
Anesthesia Evaluation  Patient identified by MRN, date of birth, ID band Patient awake    Reviewed: Allergy & Precautions, H&P , NPO status , Patient's Chart, lab work & pertinent test results, reviewed documented beta blocker date and time   History of Anesthesia Complications Negative for: history of anesthetic complications  Airway Mallampati: I  TM Distance: >3 FB Neck ROM: full    Dental  (+) Caps, Dental Advidsory Given, Teeth Intact, Chipped   Pulmonary neg pulmonary ROS,           Cardiovascular Exercise Tolerance: Good (-) hypertension(-) angina(-) Past MI + dysrhythmias Atrial Fibrillation (-) Valvular Problems/Murmurs     Neuro/Psych negative neurological ROS  negative psych ROS   GI/Hepatic Neg liver ROS, GERD  ,  Endo/Other  negative endocrine ROS  Renal/GU Renal disease (kidney stones)  negative genitourinary   Musculoskeletal   Abdominal   Peds  Hematology negative hematology ROS (+)   Anesthesia Other Findings Past Medical History: No date: History of kidney stones No date: Kidney stones   Reproductive/Obstetrics negative OB ROS                             Anesthesia Physical Anesthesia Plan  ASA: II  Anesthesia Plan: General   Post-op Pain Management:    Induction: Intravenous  PONV Risk Score and Plan: 2 and Propofol infusion and TIVA  Airway Management Planned: Natural Airway and Nasal Cannula  Additional Equipment:   Intra-op Plan:   Post-operative Plan:   Informed Consent: I have reviewed the patients History and Physical, chart, labs and discussed the procedure including the risks, benefits and alternatives for the proposed anesthesia with the patient or authorized representative who has indicated his/her understanding and acceptance.     Dental Advisory Given  Plan Discussed with: Anesthesiologist, CRNA and Surgeon  Anesthesia Plan Comments:          Anesthesia Quick Evaluation

## 2018-07-31 NOTE — CV Procedure (Signed)
Electrical Cardioversion Procedure Note Anthony Colon [id number] 26-Jan-1951  Procedure: Electrical Cardioversion Indications:  Paroxysmal non valvular atrial fibrillation  Procedure Details Consent: Risks of procedure as well as the alternatives and risks of each were explained to the (patient/caregiver).  Consent for procedure obtained. Time Out: Verified patient identification, verified procedure, site/side was marked, verified correct patient position, special equipment/implants available, medications/allergies/relevent history reviewed, required imaging and test results available.  Performed  Patient placed on cardiac monitor, pulse oximetry, supplemental oxygen as necessary.  Sedation given: Propofol and versed as per anesthesia  Pacer pads placed anterior and posterior chest.  Cardioverted 1 time(s).  Cardioverted at 120J.  Evaluation Findings: Post procedure EKG shows: NSR Complications: None Patient did tolerate procedure well.   [name] M.D. [facility] 07/31/2018, 7:46 AM

## 2019-12-03 ENCOUNTER — Encounter: Payer: Medicare HMO | Admitting: Dermatology

## 2020-03-21 ENCOUNTER — Other Ambulatory Visit: Payer: Self-pay

## 2020-03-21 ENCOUNTER — Ambulatory Visit (INDEPENDENT_AMBULATORY_CARE_PROVIDER_SITE_OTHER): Payer: Medicare HMO | Admitting: Dermatology

## 2020-03-21 DIAGNOSIS — L570 Actinic keratosis: Secondary | ICD-10-CM | POA: Diagnosis not present

## 2020-03-21 DIAGNOSIS — D1800 Hemangioma unspecified site: Secondary | ICD-10-CM

## 2020-03-21 DIAGNOSIS — L820 Inflamed seborrheic keratosis: Secondary | ICD-10-CM

## 2020-03-21 DIAGNOSIS — Z86018 Personal history of other benign neoplasm: Secondary | ICD-10-CM | POA: Diagnosis not present

## 2020-03-21 DIAGNOSIS — Z1283 Encounter for screening for malignant neoplasm of skin: Secondary | ICD-10-CM

## 2020-03-21 DIAGNOSIS — L814 Other melanin hyperpigmentation: Secondary | ICD-10-CM

## 2020-03-21 DIAGNOSIS — Z85828 Personal history of other malignant neoplasm of skin: Secondary | ICD-10-CM

## 2020-03-21 DIAGNOSIS — D229 Melanocytic nevi, unspecified: Secondary | ICD-10-CM

## 2020-03-21 DIAGNOSIS — L578 Other skin changes due to chronic exposure to nonionizing radiation: Secondary | ICD-10-CM | POA: Diagnosis not present

## 2020-03-21 DIAGNOSIS — L821 Other seborrheic keratosis: Secondary | ICD-10-CM

## 2020-03-21 NOTE — Progress Notes (Unsigned)
Follow-Up Visit   Subjective  Anthony Colon is a 70 y.o. male who presents for the following: TBSE (Patient here for full body skin exam and skin cancer screening. Patient with hx of BCC and dysplastic nevus. Nothing new or changing today that patient is aware of. ). The patient presents for Total-Body Skin Exam (TBSE) for skin cancer screening and mole check.  The following portions of the chart were reviewed this encounter and updated as appropriate:   Tobacco  Allergies  Meds  Problems  Med Hx  Surg Hx  Fam Hx     Review of Systems:  No other skin or systemic complaints except as noted in HPI or Assessment and Plan.  Objective  Well appearing patient in no apparent distress; mood and affect are within normal limits.  A full examination was performed including scalp, head, eyes, ears, nose, lips, neck, chest, axillae, abdomen, back, buttocks, bilateral upper extremities, bilateral lower extremities, hands, feet, fingers, toes, fingernails, and toenails. All findings within normal limits unless otherwise noted below.  Objective  Right Hand x 14, right forehead x 2 (16): Erythematous keratotic or waxy stuck-on papule or plaque.   Objective  Left Ear: Erythematous thin papules/macules with gritty scale.    Assessment & Plan  Inflamed seborrheic keratosis (16) Right Hand x 14, right forehead x 2  Destruction of lesion - Right Hand x 14, right forehead x 2 Complexity: simple   Destruction method: cryotherapy   Informed consent: discussed and consent obtained   Timeout:  patient name, date of birth, surgical site, and procedure verified Lesion destroyed using liquid nitrogen: Yes   Region frozen until ice ball extended beyond lesion: Yes   Outcome: patient tolerated procedure well with no complications   Post-procedure details: wound care instructions given    AK (actinic keratosis) Left Ear  Destruction of lesion - Left Ear Complexity: simple   Destruction  method: cryotherapy   Informed consent: discussed and consent obtained   Timeout:  patient name, date of birth, surgical site, and procedure verified Lesion destroyed using liquid nitrogen: Yes   Region frozen until ice ball extended beyond lesion: Yes   Outcome: patient tolerated procedure well with no complications   Post-procedure details: wound care instructions given     Lentigines - Scattered tan macules - Discussed due to sun exposure - Benign, observe - Call for any changes  Seborrheic Keratoses - Stuck-on, waxy, tan-brown papules and plaques  - Discussed benign etiology and prognosis. - Observe - Call for any changes  Melanocytic Nevi - Tan-brown and/or pink-flesh-colored symmetric macules and papules - Benign appearing on exam today - Observation - Call clinic for new or changing moles - Recommend daily use of broad spectrum spf 30+ sunscreen to sun-exposed areas.   Hemangiomas - Red papules - Discussed benign nature - Observe - Call for any changes  Actinic Damage - Chronic, secondary to cumulative UV/sun exposure - diffuse scaly erythematous macules with underlying dyspigmentation - Recommend daily broad spectrum sunscreen SPF 30+ to sun-exposed areas, reapply every 2 hours as needed.  - Call for new or changing lesions.  Skin cancer screening performed today.  History of Dysplastic Nevi - No evidence of recurrence today - Recommend regular full body skin exams - Recommend daily broad spectrum sunscreen SPF 30+ to sun-exposed areas, reapply every 2 hours as needed.  - Call if any new or changing lesions are noted between office visits  History of Basal Cell Carcinoma of the Skin -  No evidence of recurrence today - Recommend regular full body skin exams - Recommend daily broad spectrum sunscreen SPF 30+ to sun-exposed areas, reapply every 2 hours as needed.  - Call if any new or changing lesions are noted between office visits  Return in about 1 year  (around 03/21/2021) for TBSE.  Graciella Belton, RMA, am acting as scribe for Sarina Ser, MD . Documentation: I have reviewed the above documentation for accuracy and completeness, and I agree with the above.  Sarina Ser, MD

## 2020-03-21 NOTE — Patient Instructions (Addendum)

## 2020-03-22 ENCOUNTER — Encounter: Payer: Self-pay | Admitting: Dermatology

## 2021-03-29 ENCOUNTER — Encounter: Payer: Medicare HMO | Admitting: Dermatology

## 2021-05-25 ENCOUNTER — Encounter: Payer: Medicare HMO | Admitting: Dermatology

## 2021-07-06 ENCOUNTER — Emergency Department
Admission: EM | Admit: 2021-07-06 | Discharge: 2021-07-06 | Disposition: A | Discharge status: Home / Self Care | Payer: Medicare HMO | Attending: Emergency Medicine | Admitting: Emergency Medicine

## 2021-07-06 ENCOUNTER — Other Ambulatory Visit: Payer: Self-pay

## 2021-07-06 DIAGNOSIS — S81811A Laceration without foreign body, right lower leg, initial encounter: Secondary | ICD-10-CM | POA: Diagnosis not present

## 2021-07-06 DIAGNOSIS — Y92007 Garden or yard of unspecified non-institutional (private) residence as the place of occurrence of the external cause: Secondary | ICD-10-CM | POA: Diagnosis not present

## 2021-07-06 DIAGNOSIS — W540XXA Bitten by dog, initial encounter: Secondary | ICD-10-CM | POA: Diagnosis not present

## 2021-07-06 DIAGNOSIS — S81832A Puncture wound without foreign body, left lower leg, initial encounter: Secondary | ICD-10-CM | POA: Diagnosis not present

## 2021-07-06 DIAGNOSIS — S8992XA Unspecified injury of left lower leg, initial encounter: Secondary | ICD-10-CM | POA: Diagnosis present

## 2021-07-06 DIAGNOSIS — Z23 Encounter for immunization: Secondary | ICD-10-CM | POA: Insufficient documentation

## 2021-07-06 MED ORDER — AMOXICILLIN-POT CLAVULANATE 875-125 MG PO TABS
1.0000 | ORAL_TABLET | Freq: Once | ORAL | Status: AC
  Administered 2021-07-06: 1 via ORAL
  Filled 2021-07-06: qty 1

## 2021-07-06 MED ORDER — HYDROMORPHONE HCL 1 MG/ML IJ SOLN
1.0000 mg | Freq: Once | INTRAMUSCULAR | Status: DC
  Filled 2021-07-06: qty 1

## 2021-07-06 MED ORDER — AMOXICILLIN-POT CLAVULANATE 875-125 MG PO TABS: 1.0000 | ORAL_TABLET | Freq: Two times a day (BID) | ORAL | 0 refills | Status: AC

## 2021-07-06 MED ORDER — OXYCODONE-ACETAMINOPHEN 5-325 MG PO TABS
2.0000 | ORAL_TABLET | Freq: Once | ORAL | Status: AC
  Administered 2021-07-06: 2 via ORAL
  Filled 2021-07-06: qty 2

## 2021-07-06 MED ORDER — TETANUS-DIPHTH-ACELL PERTUSSIS 5-2.5-18.5 LF-MCG/0.5 IM SUSY
0.5000 mL | PREFILLED_SYRINGE | Freq: Once | INTRAMUSCULAR | Status: AC
Start: 2021-07-06 — End: 2021-07-06
  Administered 2021-07-06: 0.5 mL via INTRAMUSCULAR
  Filled 2021-07-06: qty 0.5

## 2021-07-06 MED ORDER — HYDROMORPHONE HCL 1 MG/ML IJ SOLN
1.0000 mg | Freq: Once | INTRAMUSCULAR | Status: AC
  Administered 2021-07-06: 1 mg via INTRAVENOUS

## 2021-07-06 MED ORDER — LIDOCAINE HCL (PF) 1 % IJ SOLN
15.0000 mL | Freq: Once | INTRAMUSCULAR | Status: AC
  Administered 2021-07-06: 15 mL
  Filled 2021-07-06: qty 15

## 2021-07-06 MED ORDER — CYCLOBENZAPRINE HCL 5 MG PO TABS: 5.0000 mg | ORAL_TABLET | Freq: Three times a day (TID) | ORAL | 0 refills | Status: AC | PRN

## 2021-07-06 MED ORDER — OXYCODONE-ACETAMINOPHEN 5-325 MG PO TABS: 1.0000 | ORAL_TABLET | Freq: Three times a day (TID) | ORAL | 0 refills | Status: AC | PRN

## 2021-07-06 NOTE — Discharge Instructions (Addendum)
Keep the wounds clean, dry, and covered.  Take the antibiotic as directed and pain medicines as needed.  Take OTC Tylenol and Motrin for nondrowsy pain relief follow-up with your primary provider for suture removal in 7 to 10 days.

## 2021-07-06 NOTE — ED Notes (Signed)
E signature pad not working. Pt educated on discharge instructions and verbalized understanding.  

## 2021-07-06 NOTE — ED Provider Notes (Signed)
[facility] Emergency Department Provider Note     Event Date/Time   First MD Initiated Contact with Patient 07/06/21 1131     (approximate)   History   Animal Bite (Dog bite on bilateral lower extremities)   HPI  Anthony Colon is a 71 y.o. male presents to the ED via EMS from the job site.  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  The patient was bitten in the lower legs primarily.  Patient denies any other injury at this time.  He presents with multiple puncture wounds to the left leg and a semilunar laceration to the lateral right leg.  Report to EMS at the scene did confirm that the patient had verifiable rabies vaccine records for the dog.   Physical Exam   Triage Vital Signs: ED Triage Vitals  Enc Vitals Group     BP      Pulse      Resp      Temp      Temp src      SpO2      Weight      Height      Head Circumference      Peak Flow      Pain Score      Pain Loc      Pain Edu?      Excl. in [place]?     Most recent vital signs: Vitals:   07/06/21 1142 07/06/21 1512  BP: 109/68 111/68  Pulse: 75 79  Resp: 18 18  SpO2: 98% 98%    General Awake, no distress.  CV:  Good peripheral perfusion.  RESP:  Normal effort.  ABD:  No distention.  MSK:  LLE with multiple puncture wounds around the anterior lateral calf.  1 large puncture wound to the posterior medial thigh is also appreciated.  The right lower extremity reveals a lateral similar laceration measuring approximately 3-1/2 cm with no active bleeding.   ED Results / Procedures / Treatments   Labs (all labs ordered are listed, but only abnormal results are displayed) Labs Reviewed - No data to display   EKG    RADIOLOGY   No results found.   PROCEDURES:  Critical Care performed: No  ..Laceration Repair  Date/Time: 07/06/2021 12:20 PM Performed by: Melvenia Needles, PA-C Authorized by: Melvenia Needles, PA-C   Consent:    Consent obtained:  Verbal   Consent given by:  Patient   Risks, benefits, and alternatives were discussed: yes     Risks discussed:  Infection, pain and poor wound healing   Alternatives discussed:  No treatment Universal protocol:    Site/side marked: yes     Patient identity confirmed:  Verbally with patient Laceration details:    Length (cm):  3.5   Depth (mm):  5 Pre-procedure details:    Preparation:  Patient was prepped and draped in usual sterile fashion Exploration:    Limited defect created (wound extended): no     Hemostasis achieved with:  Direct pressure   Contaminated: no   Treatment:    Area cleansed with:  Saline and povidone-iodine   Amount of cleaning:  Extensive   Irrigation solution:  Sterile saline   Irrigation volume:  20   Irrigation method:  Syringe   Debridement:  None   Scar revision:  no   Skin repair:    Repair method:  Sutures   Suture size:  4-0   Suture technique:  Horizontal mattress   Number of sutures:  1 Approximation:    Approximation:  Loose Repair type:    Repair type:  Simple Post-procedure details:    Dressing:  Bulky dressing   Procedure completion:  Tolerated well, no immediate complications   MEDICATIONS ORDERED IN ED: Medications  Tdap (BOOSTRIX) injection 0.5 mL (0.5 mLs Intramuscular Given 07/06/21 1212)  HYDROmorphone (DILAUDID) injection 1 mg (1 mg Intravenous Given 07/06/21 1211)  lidocaine (PF) (XYLOCAINE) 1 % injection 15 mL (15 mLs Infiltration Given 07/06/21 1229)  amoxicillin-clavulanate (AUGMENTIN) 875-125 MG per tablet 1 tablet (1 tablet Oral Given 07/06/21 1228)  lidocaine (PF) (XYLOCAINE) 1 % injection 15 mL (15 mLs Infiltration Given 07/06/21 1325)  oxyCODONE-acetaminophen (PERCOCET/ROXICET) 5-325 MG per tablet 2 tablet (2 tablets Oral Given 07/06/21 1413)     IMPRESSION / MDM / ASSESSMENT AND PLAN / ED COURSE  I reviewed the triage vital  signs and the nursing notes.                              Differential diagnosis includes, but is not limited to, dog bites, abrasions, ankle sprain  Patient to the ED for evaluation of multiple dog bites after a provoked attack.  Patient's diagnosis is consistent with dog bites to the bilateral lower extremities.  Patient consents to wound management and after adequate anesthesia was achieved, the larger wound to the lateral right leg was loosely approximated.  Patient was being discharged, when the one of the multiple puncture wounds to the LLE, began to bleed profusely.  My attending Vladimir Crofts, attended to the patient, performing simple wound repair to 2-3 of the deep puncture wounds.  (See separate procedure note) patient will be discharged home with prescriptions for Percocet, Lexapro, and Augmentin. Patient is to follow up with his primary provider for suture repair and intermittent wound check, as needed or otherwise directed. Patient is given ED precautions to return to the ED for any worsening or new symptoms.    Patient's presentation is most consistent with acute, uncomplicated illness.  FINAL CLINICAL IMPRESSION(S) / ED DIAGNOSES   Final diagnoses:  Dog bite, initial encounter     Rx / DC Orders   ED Discharge Orders          Ordered    amoxicillin-clavulanate (AUGMENTIN) 875-125 MG tablet  2 times daily        07/06/21 1358    cyclobenzaprine (FLEXERIL) 5 MG tablet  3 times daily PRN        07/06/21 1358    oxyCODONE-acetaminophen (PERCOCET) 5-325 MG tablet  Every 8 hours PRN        07/06/21 1358             Note:  This document was prepared using Dragon voice recognition software and may include unintentional dictation errors.    Melvenia Needles, PA-C 07/06/21 1925    Vladimir Crofts, MD 07/06/21 Kathyrn Drown

## 2021-07-06 NOTE — ED Notes (Signed)
Lavender, green and blue top tubes drawn and sent to lab.

## 2021-07-06 NOTE — ED Triage Notes (Signed)
Pt arrives to ED from job site via [place] with c/c of dog bite to bilateral lower extremities. Pt states he was at a customers residence and was attacked by the customers dog. Dog was fully up to date on its immunizations, visually verified by EMS. Puncture wounds evident on inside of left upper thigh (controlled), left lower shin controlled by direct pressure, and avusion wound to right lateral shin. Upon arrival, pt A&Ox4, NAD. Transport vitals reported by EMS as p80, 130/86, O2 sat 100% on room air.

## 2021-09-04 ENCOUNTER — Ambulatory Visit: Payer: Medicare HMO | Admitting: Dermatology

## 2021-09-04 ENCOUNTER — Encounter: Payer: Self-pay | Admitting: Dermatology

## 2021-09-04 DIAGNOSIS — Z85828 Personal history of other malignant neoplasm of skin: Secondary | ICD-10-CM | POA: Diagnosis not present

## 2021-09-04 DIAGNOSIS — C44310 Basal cell carcinoma of skin of unspecified parts of face: Secondary | ICD-10-CM

## 2021-09-04 DIAGNOSIS — L578 Other skin changes due to chronic exposure to nonionizing radiation: Secondary | ICD-10-CM | POA: Diagnosis not present

## 2021-09-04 DIAGNOSIS — D1800 Hemangioma unspecified site: Secondary | ICD-10-CM

## 2021-09-04 DIAGNOSIS — L814 Other melanin hyperpigmentation: Secondary | ICD-10-CM

## 2021-09-04 DIAGNOSIS — D239 Other benign neoplasm of skin, unspecified: Secondary | ICD-10-CM | POA: Diagnosis not present

## 2021-09-04 DIAGNOSIS — Z86018 Personal history of other benign neoplasm: Secondary | ICD-10-CM

## 2021-09-04 DIAGNOSIS — L821 Other seborrheic keratosis: Secondary | ICD-10-CM

## 2021-09-04 DIAGNOSIS — D229 Melanocytic nevi, unspecified: Secondary | ICD-10-CM

## 2021-09-04 DIAGNOSIS — Z1283 Encounter for screening for malignant neoplasm of skin: Secondary | ICD-10-CM

## 2021-09-04 NOTE — Progress Notes (Signed)
   Follow-Up Visit   Subjective  Anthony Colon is a 71 y.o. male who presents for the following: Annual Exam (Here for skin cancer screening. Full body. Hx of BCC, HxDN. Areas of concern today). The patient presents for Total-Body Skin Exam (TBSE) for skin cancer screening and mole check.  The patient has spots, moles and lesions to be evaluated, some may be new or changing and the patient has concerns that these could be cancer.  The following portions of the chart were reviewed this encounter and updated as appropriate:  Tobacco  Allergies  Meds  Problems  Med Hx  Surg Hx  Fam Hx     Review of Systems: No other skin or systemic complaints except as noted in HPI or Assessment and Plan.  Objective  Well appearing patient in no apparent distress; mood and affect are within normal limits.  A full examination was performed including scalp, head, eyes, ears, nose, lips, neck, chest, axillae, abdomen, back, buttocks, bilateral upper extremities, bilateral lower extremities, hands, feet, fingers, toes, fingernails, and toenails. All findings within normal limits unless otherwise noted below.   Assessment & Plan   History of Basal Cell Carcinoma of the Skin. Right forehead. 07/2014 - No evidence of recurrence today - Recommend regular full body skin exams - Recommend daily broad spectrum sunscreen SPF 30+ to sun-exposed areas, reapply every 2 hours as needed.  - Call if any new or changing lesions are noted between office visits   History of Dysplastic Nevus. Left posterior shoulder, moderate. 04/2013. - No evidence of recurrence today - Recommend regular full body skin exams - Recommend daily broad spectrum sunscreen SPF 30+ to sun-exposed areas, reapply every 2 hours as needed.  - Call if any new or changing lesions are noted between office visits   Lentigines - Scattered tan macules - Due to sun exposure - Benign-appearing, observe - Recommend daily broad spectrum sunscreen  SPF 30+ to sun-exposed areas, reapply every 2 hours as needed. - Call for any changes  Seborrheic Keratoses - Stuck-on, waxy, tan-brown papules and/or plaques  - Benign-appearing - Discussed benign etiology and prognosis. - Observe - Call for any changes  Melanocytic Nevi - Tan-brown and/or pink-flesh-colored symmetric macules and papules - Benign appearing on exam today - Observation - Call clinic for new or changing moles - Recommend daily use of broad spectrum spf 30+ sunscreen to sun-exposed areas.   Hemangiomas - Red papules - Discussed benign nature - Observe - Call for any changes  Actinic Damage - Chronic condition, secondary to cumulative UV/sun exposure - diffuse scaly erythematous macules with underlying dyspigmentation - Recommend daily broad spectrum sunscreen SPF 30+ to sun-exposed areas, reapply every 2 hours as needed.  - Staying in the shade or wearing long sleeves, sun glasses (UVA+UVB protection) and wide brim hats (4-inch brim around the entire circumference of the hat) are also recommended for sun protection.  - Call for new or changing lesions.  Skin cancer screening performed today.   Return in about 1 year (around 09/05/2022) for TBSE, HxBCC, HxDN.  I, Emelia Salisbury, CMA, am acting as scribe for Sarina Ser, MD. Documentation: I have reviewed the above documentation for accuracy and completeness, and I agree with the above.  Sarina Ser, MD

## 2021-09-04 NOTE — Patient Instructions (Signed)
Recommend daily broad spectrum sunscreen SPF 30+ to sun-exposed areas, reapply every 2 hours as needed. Call for new or changing lesions.  Staying in the shade or wearing long sleeves, sun glasses (UVA+UVB protection) and wide brim hats (4-inch brim around the entire circumference of the hat) are also recommended for sun protection.    Melanoma ABCDEs  Melanoma is the most dangerous type of skin cancer, and is the leading cause of death from skin disease.  You are more likely to develop melanoma if you: Have light-colored skin, light-colored eyes, or red or blond hair Spend a lot of time in the sun Tan regularly, either outdoors or in a tanning bed Have had blistering sunburns, especially during childhood Have a close family member who has had a melanoma Have atypical moles or large birthmarks  Early detection of melanoma is key since treatment is typically straightforward and cure rates are extremely high if we catch it early.   The first sign of melanoma is often a change in a mole or a new dark spot.  The ABCDE system is a way of remembering the signs of melanoma.  A for asymmetry:  The two halves do not match. B for border:  The edges of the growth are irregular. C for color:  A mixture of colors are present instead of an even brown color. D for diameter:  Melanomas are usually (but not always) greater than 6mm - the size of a pencil eraser. E for evolution:  The spot keeps changing in size, shape, and color.  Please check your skin once per month between visits. You can use a small mirror in front and a large mirror behind you to keep an eye on the back side or your body.   If you see any new or changing lesions before your next follow-up, please call to schedule a visit.  Please continue daily skin protection including broad spectrum sunscreen SPF 30+ to sun-exposed areas, reapplying every 2 hours as needed when you're outdoors.   Staying in the shade or wearing long sleeves, sun  glasses (UVA+UVB protection) and wide brim hats (4-inch brim around the entire circumference of the hat) are also recommended for sun protection.     Due to recent changes in healthcare laws, you may see results of your pathology and/or laboratory studies on MyChart before the doctors have had a chance to review them. We understand that in some cases there may be results that are confusing or concerning to you. Please understand that not all results are received at the same time and often the doctors may need to interpret multiple results in order to provide you with the best plan of care or course of treatment. Therefore, we ask that you please give us 2 business days to thoroughly review all your results before contacting the office for clarification. Should we see a critical lab result, you will be contacted sooner.   If You Need Anything After Your Visit  If you have any questions or concerns for your doctor, please call our main line at [phone] and press option 4 to reach your doctor's medical assistant. If no one answers, please leave a voicemail as directed and we will return your call as soon as possible. Messages left after 4 pm will be answered the following business day.   You may also send us a message via MyChart. We typically respond to MyChart messages within 1-2 business days.  For prescription refills, please ask your pharmacy to contact   our office. Our fax number is [phone].  If you have an urgent issue when the clinic is closed that cannot wait until the next business day, you can page your doctor at the number below.    Please note that while we do our best to be available for urgent issues outside of office hours, we are not available 24/7.   If you have an urgent issue and are unable to reach us, you may choose to seek medical care at your doctor's office, retail clinic, urgent care center, or emergency room.  If you have a medical emergency, please immediately  call 911 or go to the emergency department.  Pager Numbers  - Dr. Kowalski: [phone]  - Dr. Moye: [phone]  - Dr. Stewart: [phone]  In the event of inclement weather, please call our main line at [phone] for an update on the status of any delays or closures.  Dermatology Medication Tips: Please keep the boxes that topical medications come in in order to help keep track of the instructions about where and how to use these. Pharmacies typically print the medication instructions only on the boxes and not directly on the medication tubes.   If your medication is too expensive, please contact our office at [phone] option 4 or send us a message through MyChart.   We are unable to tell what your co-pay for medications will be in advance as this is different depending on your insurance coverage. However, we may be able to find a substitute medication at lower cost or fill out paperwork to get insurance to cover a needed medication.   If a prior authorization is required to get your medication covered by your insurance company, please allow us 1-2 business days to complete this process.  Drug prices often vary depending on where the prescription is filled and some pharmacies may offer cheaper prices.  The website www.goodrx.com contains coupons for medications through different pharmacies. The prices here do not account for what the cost may be with help from insurance (it may be cheaper with your insurance), but the website can give you the price if you did not use any insurance.  - You can print the associated coupon and take it with your prescription to the pharmacy.  - You may also stop by our office during regular business hours and pick up a GoodRx coupon card.  - If you need your prescription sent electronically to a different pharmacy, notify our office through [facility] MyChart or by phone at [phone] option 4.     Si Usted Necesita Algo Despus de Su  Visita  Tambin puede enviarnos un mensaje a travs de MyChart. Por lo general respondemos a los mensajes de MyChart en el transcurso de 1 a 2 das hbiles.  Para renovar recetas, por favor pida a su farmacia que se ponga en contacto con nuestra oficina. Nuestro nmero de fax es el [phone].  Si tiene un asunto urgente cuando la clnica est cerrada y que no puede esperar hasta el siguiente da hbil, puede llamar/localizar a su doctor(a) al nmero que aparece a continuacin.   Por favor, tenga en cuenta que aunque hacemos todo lo posible para estar disponibles para asuntos urgentes fuera del horario de oficina, no estamos disponibles las 24 horas del da, los 7 das de la semana.   Si tiene un problema urgente y no puede comunicarse con nosotros, puede optar por buscar atencin mdica  en el consultorio de su doctor(a), en una clnica privada,   en un centro de atencin urgente o en una sala de emergencias.  Si tiene una emergencia mdica, por favor llame inmediatamente al 911 o vaya a la sala de emergencias.  Nmeros de bper  - Dr. Kowalski: [phone]  - Dra. Moye: [phone]  - Dra. Stewart: [phone]  En caso de inclemencias del tiempo, por favor llame a nuestra lnea principal al [phone] para una actualizacin sobre el estado de cualquier retraso o cierre.  Consejos para la medicacin en dermatologa: Por favor, guarde las cajas en las que vienen los medicamentos de uso tpico para ayudarle a seguir las instrucciones sobre dnde y cmo usarlos. Las farmacias generalmente imprimen las instrucciones del medicamento slo en las cajas y no directamente en los tubos del medicamento.   Si su medicamento es muy caro, por favor, pngase en contacto con nuestra oficina llamando al [phone] y presione la opcin 4 o envenos un mensaje a travs de MyChart.   No podemos decirle cul ser su copago por los medicamentos por adelantado ya que esto es diferente dependiendo de la  cobertura de su seguro. Sin embargo, es posible que podamos encontrar un medicamento sustituto a menor costo o llenar un formulario para que el seguro cubra el medicamento que se considera necesario.   Si se requiere una autorizacin previa para que su compaa de seguros cubra su medicamento, por favor permtanos de 1 a 2 das hbiles para completar este proceso.  Los precios de los medicamentos varan con frecuencia dependiendo del lugar de dnde se surte la receta y alguna farmacias pueden ofrecer precios ms baratos.  El sitio web www.goodrx.com tiene cupones para medicamentos de diferentes farmacias. Los precios aqu no tienen en cuenta lo que podra costar con la ayuda del seguro (puede ser ms barato con su seguro), pero el sitio web puede darle el precio si no utiliz ningn seguro.  - Puede imprimir el cupn correspondiente y llevarlo con su receta a la farmacia.  - Tambin puede pasar por nuestra oficina durante el horario de atencin regular y recoger una tarjeta de cupones de GoodRx.  - Si necesita que su receta se enve electrnicamente a una farmacia diferente, informe a nuestra oficina a travs de MyChart de [place] o por telfono llamando al [phone] y presione la opcin 4.  

## 2022-09-04 ENCOUNTER — Other Ambulatory Visit: Payer: Self-pay | Admitting: Family Medicine

## 2022-09-04 DIAGNOSIS — Z9189 Other specified personal risk factors, not elsewhere classified: Secondary | ICD-10-CM

## 2022-09-04 DIAGNOSIS — Z0000 Encounter for general adult medical examination without abnormal findings: Secondary | ICD-10-CM

## 2022-09-17 ENCOUNTER — Ambulatory Visit
Admission: RE | Admit: 2022-09-17 | Discharge: 2022-09-17 | Disposition: A | Discharge status: Home / Self Care | Payer: Medicare HMO | Source: Ambulatory Visit | Attending: Family Medicine | Admitting: Family Medicine

## 2022-09-17 DIAGNOSIS — Z9189 Other specified personal risk factors, not elsewhere classified: Secondary | ICD-10-CM | POA: Insufficient documentation

## 2022-09-17 DIAGNOSIS — Z0000 Encounter for general adult medical examination without abnormal findings: Secondary | ICD-10-CM | POA: Insufficient documentation

## 2022-10-10 ENCOUNTER — Encounter: Payer: Self-pay | Admitting: Dermatology

## 2022-10-10 ENCOUNTER — Ambulatory Visit (INDEPENDENT_AMBULATORY_CARE_PROVIDER_SITE_OTHER): Payer: Medicare HMO | Admitting: Dermatology

## 2022-10-10 DIAGNOSIS — L820 Inflamed seborrheic keratosis: Secondary | ICD-10-CM

## 2022-10-10 DIAGNOSIS — L578 Other skin changes due to chronic exposure to nonionizing radiation: Secondary | ICD-10-CM | POA: Diagnosis not present

## 2022-10-10 DIAGNOSIS — W908XXA Exposure to other nonionizing radiation, initial encounter: Secondary | ICD-10-CM

## 2022-10-10 DIAGNOSIS — L821 Other seborrheic keratosis: Secondary | ICD-10-CM

## 2022-10-10 DIAGNOSIS — L570 Actinic keratosis: Secondary | ICD-10-CM | POA: Diagnosis not present

## 2022-10-10 DIAGNOSIS — Z1283 Encounter for screening for malignant neoplasm of skin: Secondary | ICD-10-CM | POA: Diagnosis not present

## 2022-10-10 DIAGNOSIS — D229 Melanocytic nevi, unspecified: Secondary | ICD-10-CM

## 2022-10-10 DIAGNOSIS — L814 Other melanin hyperpigmentation: Secondary | ICD-10-CM | POA: Diagnosis not present

## 2022-10-10 DIAGNOSIS — Z85828 Personal history of other malignant neoplasm of skin: Secondary | ICD-10-CM

## 2022-10-10 DIAGNOSIS — D1801 Hemangioma of skin and subcutaneous tissue: Secondary | ICD-10-CM

## 2022-10-10 DIAGNOSIS — Z86018 Personal history of other benign neoplasm: Secondary | ICD-10-CM

## 2022-10-10 NOTE — Progress Notes (Signed)
Follow-Up Visit   Subjective  Anthony Colon is a 72 y.o. male who presents for the following: Skin Cancer Screening and Full Body Skin Exam  The patient presents for Total-Body Skin Exam (TBSE) for skin cancer screening and mole check. The patient has spots, moles and lesions to be evaluated, some may be new or changing and the patient may have concern these could be cancer.  Hx of BCC, dysplastic nevus.   The following portions of the chart were reviewed this encounter and updated as appropriate: medications, allergies, medical history  Review of Systems:  No other skin or systemic complaints except as noted in HPI or Assessment and Plan.  Objective  Well appearing patient in no apparent distress; mood and affect are within normal limits.  A full examination was performed including scalp, head, eyes, ears, nose, lips, neck, chest, axillae, abdomen, back, buttocks, bilateral upper extremities, bilateral lower extremities, hands, feet, fingers, toes, fingernails, and toenails. All findings within normal limits unless otherwise noted below.   Relevant physical exam findings are noted in the Assessment and Plan.  R nose x 2 (2) Erythematous thin papules/macules with gritty scale.   R forehead x 1, R popliteal x 1, face x 9 (11) Erythematous stuck-on, waxy papule or plaque    Assessment & Plan   SKIN CANCER SCREENING PERFORMED TODAY.  ACTINIC DAMAGE - Chronic condition, secondary to cumulative UV/sun exposure - diffuse scaly erythematous macules with underlying dyspigmentation - Recommend daily broad spectrum sunscreen SPF 30+ to sun-exposed areas, reapply every 2 hours as needed.  - Staying in the shade or wearing long sleeves, sun glasses (UVA+UVB protection) and wide brim hats (4-inch brim around the entire circumference of the hat) are also recommended for sun protection.  - Call for new or changing lesions.  LENTIGINES, SEBORRHEIC KERATOSES, HEMANGIOMAS - Benign normal  skin lesions - Benign-appearing - Call for any changes  MELANOCYTIC NEVI - Tan-brown and/or pink-flesh-colored symmetric macules and papules - Benign appearing on exam today - Observation - Call clinic for new or changing moles - Recommend daily use of broad spectrum spf 30+ sunscreen to sun-exposed areas.   History of Basal Cell Carcinoma of the Skin. Right forehead. 07/2014 - No evidence of recurrence today - Recommend regular full body skin exams - Recommend daily broad spectrum sunscreen SPF 30+ to sun-exposed areas, reapply every 2 hours as needed.  - Call if any new or changing lesions are noted between office visits    History of Dysplastic Nevus. Left posterior shoulder, moderate. 04/2013. - No evidence of recurrence today - Recommend regular full body skin exams - Recommend daily broad spectrum sunscreen SPF 30+ to sun-exposed areas, reapply every 2 hours as needed.  - Call if any new or changing lesions are noted between office visits   AK (actinic keratosis) (2) R nose x 2  Actinic keratoses are precancerous spots that appear secondary to cumulative UV radiation exposure/sun exposure over time. They are chronic with expected duration over 1 year. A portion of actinic keratoses will progress to squamous cell carcinoma of the skin. It is not possible to reliably predict which spots will progress to skin cancer and so treatment is recommended to prevent development of skin cancer.  Recommend daily broad spectrum sunscreen SPF 30+ to sun-exposed areas, reapply every 2 hours as needed.  Recommend staying in the shade or wearing long sleeves, sun glasses (UVA+UVB protection) and wide brim hats (4-inch brim around the entire circumference of the hat). Call  for new or changing lesions.   Destruction of lesion - R nose x 2 (2)  Destruction method: cryotherapy   Informed consent: discussed and consent obtained   Lesion destroyed using liquid nitrogen: Yes   Cryotherapy cycles:   2 Outcome: patient tolerated procedure well with no complications   Post-procedure details: wound care instructions given    Inflamed seborrheic keratosis (11) R forehead x 1, R popliteal x 1, face x 9  Symptomatic, irritating, patient would like treated.  Benign-appearing.  Call clinic for new or changing lesions.    Destruction of lesion - R forehead x 1, R popliteal x 1, face x 9 (11)  Destruction method: cryotherapy   Informed consent: discussed and consent obtained   Lesion destroyed using liquid nitrogen: Yes   Cryotherapy cycles:  2 Outcome: patient tolerated procedure well with no complications   Post-procedure details: wound care instructions given     Return in about 1 year (around 10/10/2023) for TBSE, with Dr. Kirtland Bouchard, Hx Dysplastic Nevi, Hx BCC, Hx AK.  Anise Salvo, RMA, am acting as scribe for Armida Sans, MD .   Documentation: I have reviewed the above documentation for accuracy and completeness, and I agree with the above.  Armida Sans, MD

## 2022-10-10 NOTE — Patient Instructions (Addendum)

## 2023-04-23 ENCOUNTER — Encounter: Payer: Self-pay | Admitting: Dermatology

## 2023-04-23 ENCOUNTER — Ambulatory Visit: Admitting: Dermatology

## 2023-04-23 DIAGNOSIS — L720 Epidermal cyst: Secondary | ICD-10-CM

## 2023-04-23 DIAGNOSIS — D492 Neoplasm of unspecified behavior of bone, soft tissue, and skin: Secondary | ICD-10-CM

## 2023-04-23 MED ORDER — MUPIROCIN 2 % EX OINT: TOPICAL_OINTMENT | CUTANEOUS | 0 refills | Status: AC

## 2023-04-23 NOTE — Progress Notes (Signed)
   Follow-Up Visit   Subjective  Anthony Colon is a 73 y.o. male who presents for the following: The patient has a lesion on his right leg to be evaluated, this lesion appeared several months ago after patient had a dog bite in this area, patient concerned because the area on this lesion is turning colors.    The following portions of the chart were reviewed this encounter and updated as appropriate: medications, allergies, medical history  Review of Systems:  No other skin or systemic complaints except as noted in HPI or Assessment and Plan.  Objective  Well appearing patient in no apparent distress; mood and affect are within normal limits.  A focused examination was performed of the following areas:right leg  Relevant exam findings are noted in the Assessment and Plan.     right lower lateral leg 1.1 cm Subcutaneous nodule with surrounding hyperpigmentation on the right lateral   Assessment & Plan   NEOPLASM OF SKIN right lower lateral leg Skin excision  Total excision diameter (cm):  1.1 Informed consent: discussed and consent obtained   Timeout: patient name, date of birth, surgical site, and procedure verified   Procedure prep:  Patient was prepped and draped in usual sterile fashion Prep type:  Isopropyl alcohol and povidone-iodine Anesthesia: the lesion was anesthetized in a standard fashion   Anesthetic:  1% lidocaine w/ epinephrine 1-100,000 buffered w/ 8.4% NaHCO3 Instrument used: #11 blade   Hemostasis achieved with: pressure   Outcome: patient tolerated procedure well with no complications   Post-procedure details: sterile dressing applied and wound care instructions given   Dressing type: bandage and pressure dressing (Mupirocin)   Additional details:  Curettage and irrigation performed   Specimen 1 - Surgical pathology Differential Diagnosis: R/O Cyst   Check Margins: No Dicussed patient the possibility of cyst recurrence even after simple excision  today.  EPIDERMOID CYST    Return if symptoms worsen or fail to improve.  IAngelique Holm, CMA, am acting as scribe for Armida Sans, MD .   Documentation: I have reviewed the above documentation for accuracy and completeness, and I agree with the above.  Armida Sans, MD

## 2023-04-23 NOTE — Patient Instructions (Addendum)

## 2023-04-25 LAB — SURGICAL PATHOLOGY

## 2023-04-29 ENCOUNTER — Encounter: Payer: Self-pay | Admitting: Dermatology

## 2023-04-29 ENCOUNTER — Telehealth: Payer: Self-pay

## 2023-04-29 NOTE — Telephone Encounter (Addendum)
 Called and discussed bx results with patient. He verbalized understanding and denied further questions.  ----- Message from Armida Sans sent at 04/29/2023 12:22 PM EDT ----- FINAL DIAGNOSIS        1. Skin, right lower lateral leg :       CONSISTENT WITH EPIDERMOID CYST   Benign cyst May be related to previous dog bite in area Possible chance of recurrence No further treatment needed at this time

## 2023-05-01 ENCOUNTER — Ambulatory Visit: Payer: Medicare HMO | Admitting: Dermatology

## 2023-09-19 NOTE — Progress Notes (Signed)
 CC: Preventative Health Exam  HPI  Anthony Colon is a 73 y.o. here for preventative health exam and subsequent medicare wellness  Preventative health exam: No acute issues.  Chronic medical issues stable and tolerating medications without adverse effects.  No regular exercise or specific healthy diet.  No exertional cp or syncopal episodes.  No urinary issues or rectal pain/bleeding.  Denies any tobacco use.    ROS Review of systems is unremarkable for any active cardiac, respiratory, GI, GU, hematologic, neurologic, dermatologic, HEENT, or psychiatric symptoms except as noted above.  No fevers, chills, or constitutional symptoms.   Current Outpatient Medications  Medication Sig Dispense Refill  . aspirin 81 MG EC tablet Take 81 mg by mouth once daily    . omeprazole (PRILOSEC) 20 MG DR capsule Take 20 mg by mouth once daily    . rosuvastatin (CRESTOR) 5 MG tablet Take 1 tablet (5 mg total) by mouth once daily 90 tablet 3   No current facility-administered medications for this visit.    Allergies as of 09/19/2023 - Reviewed 09/19/2023  Allergen Reaction Noted  . Ciprofloxacin Swelling 08/31/2013    Patient Active Problem List  Diagnosis  . Premature ventricular contraction  . Class 1 obesity due to excess calories with serious comorbidity and body mass index (BMI) of 33.0 to 33.9 in adult  . Status post ablation of atrial fibrillation on 09/10/18 previously on Eliquis  . Medicare annual wellness visit, initial 07/10/18  . History of adenomatous polyp of colon  . Thyroid nodule (found on MRA 2020)  . Paroxysmal A-fib (CMS-HCC) - followed by Dr. Will  . Medicare annual wellness visit, subsequent 09/19/23  . History of colon polyps (02/13/19 - repeat 5 yrs)  . Moderate mitral insufficiency  . 10 year risk of MI or stroke 7.5% or greater  . Thrombocytosis (650 - 02/28/23)  . Coronary artery disease involving native coronary artery of native heart without angina pectoris - followed  by Dr. Alvia ([facility])    Past Medical History:  Diagnosis Date  . Benign essential HTN   . Borderline hypertension   . Coronary artery disease involving native coronary artery of native heart without angina pectoris - followed by Dr. Alvia ([facility]) 03/07/2023  . GERD (gastroesophageal reflux disease)   . History of adenomatous polyp of colon   . Hyperlipidemia   . Paroxysmal A-fib (CMS/HHS-HCC) 07/02/2018  . Paroxysmal SVT (supraventricular tachycardia) (CMS/HHS-HCC) 09/17/2019  . Stage 3a chronic kidney disease (Cr 1.4 and GFR 50 - 08/25/21) 09/01/2021  . Thyroid nodule (found on MRA 2020) 09/19/2018    Past Surgical History:  Procedure Laterality Date  . KNEE ARTHROSCOPY Left 04/20/2008   Menisectomy - Medial, femur microfracture, patella chondroplasties  . COLONOSCOPY    . EXCISION PILONIDAL CYST/SINUS  1970s    Family History  Problem Relation Name Age of Onset  . Colon polyps Mother    . Heart disease Father Gurman Ashland        4 bypass @age  75 he is now 35  . Hyperlipidemia (Elevated cholesterol) Father Vishwa Dais   . Prostate cancer Father Davidjames Blansett   . Coronary Artery Disease (Blocked arteries around heart) Father Willma Minors   . Colon cancer Maternal Uncle    . Anesthesia problems Neg Hx      Social History   Socioeconomic History  . Marital status: Married  Tobacco Use  . Smoking status: Never    Passive exposure: Never  . Smokeless tobacco: Former  Types: [place]    Quit date: 02/05/1998  . Tobacco comments:    chewed tobacco but quit in the year 2000  Vaping Use  . Vaping status: Never Used  Substance and Sexual Activity  . Alcohol use: No    Alcohol/week: 0.0 standard drinks of alcohol  . Drug use: No  . Sexual activity: Defer   Social Drivers of Health   Financial Resource Strain: Low Risk  (09/17/2023)   Overall Financial Resource Strain (CARDIA)   . Difficulty of Paying Living Expenses: Not hard at all  Food Insecurity: No Food Insecurity  (09/17/2023)   Hunger Vital Sign   . Worried About Programme Researcher, Broadcasting/film/video in the Last Year: Never true   . Ran Out of Food in the Last Year: Never true  Transportation Needs: No Transportation Needs (09/17/2023)   PRAPARE - Transportation   . Lack of Transportation (Medical): No   . Lack of Transportation (Non-Medical): No  Housing Stability: Low Risk  (09/17/2023)   Housing Stability Vital Sign   . Unable to Pay for Housing in the Last Year: No   . Number of Times Moved in the Last Year: 0   . Homeless in the Last Year: No    Health Maintenance  Topic Date Due  . Pneumococcal Vaccine: 50+ (1 of 1 - PCV) Never done  . Shingrix (1 of 2) Never done  . Medicare Subsequent AWV [id number]  09/04/2023  . Annual Physical/Well Child Check  09/04/2023  . Influenza Vaccine (1) 10/07/2023  . COVID-19 Vaccine (2 - 2024-25 season) 03/06/2024 (Originally 10/07/2022)  . Colorectal Cancer Screening  02/13/2024  . Lipid Panel  09/04/2024  . Depression Screening  09/16/2024  . Diabetes Screening  08/26/2025  . PSA  09/04/2025  . RSV Immunization Pregnant or 60+ (1 - 1-dose 75+ series) 04/03/2027  . Adult Tetanus (Td And Tdap)  07/07/2031  . Hib Vaccines  Aged Out  . Hepatitis A Vaccines  Aged Out  . Meningococcal B Vaccine  Aged Out  . Meningococcal ACWY Vaccine  Aged Out  . HPV Vaccines  Aged Out  . Hepatitis C Screen  Discontinued    Vitals:   09/19/23 0901  BP: 130/80  Pulse: 56  SpO2: 97%  Weight: (!) 116.6 kg (257 lb)  Height: 188 cm (6' 2)  PainSc: 0-No pain   Body mass index is 33 kg/m.  Exam  General. Well appearing; NAD; VS reviewed     Eyes. Sclera and conjunctiva clear; Vision grossly intact; extraocular movements intact Neck. Supple. No swelling, masses, thyroid normal size, no masses palpated.  Lungs. Respirations unlabored; clear to auscultation bilaterally Cardiovascular. Heart regular rate and rhythm without murmurs, gallops, or rubs Abdomen. Soft; non tender; non  distended; normoactive bowel sounds; no masses or organomegaly Lymph Nodes. No significant cervical or supraclavicular lymphadenopathy noted Musculoskeletal. No deformities; no active joint inflammation Extremities. no edema Skin. Normal color and turgor Pulses. Dorsalis pedis palpable and symmetric bilaterally Neurologic. Alert and oriented x3; CN 2-12 grossly intact; no focal deficits  Assessment and Plan  1. Preventative health exam- Stable exam except for elevated BMI.  CV screening labs reviewed with patient.  HLD at goal. No DM. No HTN.  CBC wnls except chronic thrombocytosis (Plt 596).  Reviewed PSA 1.07 (09/19/23) w/ pt.  Asymptomatic.  Up to date on colonoscopy (02/13/19 - repeat 5 yrs).  Vaccinations reviewed.  Eligible for shingrix and PCV 20.  Counseled on nutrition modification and exercise (strength and  cardio).  2. Subsequent medicare wellness Providers Rendering Care 1. Dr. Alda Carpen (PCP) 2. Dr. [facility])   Functional Assessment (1) Hearing: B/L hearing aides (2) Risk of Falls: Patient denies any falls or near falls in the last year, Gait steady without assistance during walk from waiting area to exam room (3) Home Safety: Patient feels secure in their home, There are operational smoke alarms in multiple areas of the home (4) Activities of Daily Living: Independently manages personal grooming and household chores, including cooking, cleaning and laundry. Manages Personal finances without assistance.   PHQ 2/9 last 3 flowsheet values     09/01/2021 09/03/2022 09/17/2023  PHQ-9 Depression Screening   Little interest or pleasure in doing things  0 0  Feeling down, depressed, or hopeless  0 0  (OBSOLETE) Little interest or pleasure in doing things 0    (OBSOLETE) Feeling down, depressed, or hopeless (or irritable for Teens only)? 0    (OBSOLETE) Total Score = 0        Depression Severity and Treatment Recommendations:  0-4= None  5-9= Mild /  Treatment: Support, educate to call if worse; return in one month  10-14= Moderate / Treatment: Support, watchful waiting; Antidepressant or Psychotherapy  15-19= Moderately severe / Treatment: Antidepressant OR Psychotherapy  >= 20 = Major depression, severe / Antidepressant AND Psychotherapy      Cognitive Impairment Patient denies episodes of loosing things, being forgetful. Seems oriented to person, place and time.  Responses appear appropriate and timely to this observer.   PREVENTION PLAN   Cardiovascular: FLP annually Diabetes: FBG annually  Glaucoma: Neg per pt Hepatitis B (HBV) Vaccine:  Not Applicable Smoking Cessation:  Not Applicable   Other Personalized Health Advice   Encouraged patient to exercise 5 days a week, walking, water aerobics, gentle stretching recommended. Increase dietary intake of fresh fruits and vegetables, reduce red meat to twice a week.   End of Life Counseling Patient has living will in place; POA - Jasin Brazel (spouse); Full Code   Medications and allergies reviewed and reconciled.  Preventive health and labs reviewed with patient.   Goals Addressed               This Visit's Progress   . * Exercise (x goals) (pt-stated)   Not on track     5x weekly    . * Lose Weight (pt-stated)   Not on track     Patient states he would love to weigh around 215 pounds. Patient states his dad has dementia and he has spent most time with him. Patient states since he has been spending more time with his dad he has let his self go. Patient is going to go on a 400 mile bicycle ride. [place] is looking forward to it.     . * Maintain health/healthy lifestyle (pt-stated)   On track      Follow up: 6 months for reck; labs prior  ALDA CARPEN, MD *Some images could not be shown.

## 2023-10-17 ENCOUNTER — Ambulatory Visit: Payer: Medicare HMO | Admitting: Dermatology

## 2023-12-16 ENCOUNTER — Emergency Department

## 2023-12-16 ENCOUNTER — Other Ambulatory Visit: Payer: Self-pay

## 2023-12-16 ENCOUNTER — Inpatient Hospital Stay

## 2023-12-16 ENCOUNTER — Inpatient Hospital Stay
Admission: EM | Admit: 2023-12-16 | Discharge: 2023-12-18 | DRG: 502 | Disposition: A | Discharge status: Home Health | Attending: Internal Medicine | Admitting: Internal Medicine

## 2023-12-16 DIAGNOSIS — E66811 Obesity, class 1: Secondary | ICD-10-CM | POA: Diagnosis present

## 2023-12-16 DIAGNOSIS — Z6831 Body mass index (BMI) 31.0-31.9, adult: Secondary | ICD-10-CM

## 2023-12-16 DIAGNOSIS — S76111A Strain of right quadriceps muscle, fascia and tendon, initial encounter: Secondary | ICD-10-CM | POA: Diagnosis present

## 2023-12-16 DIAGNOSIS — E7849 Other hyperlipidemia: Secondary | ICD-10-CM | POA: Diagnosis not present

## 2023-12-16 DIAGNOSIS — Z7901 Long term (current) use of anticoagulants: Secondary | ICD-10-CM

## 2023-12-16 DIAGNOSIS — Z79899 Other long term (current) drug therapy: Secondary | ICD-10-CM

## 2023-12-16 DIAGNOSIS — E785 Hyperlipidemia, unspecified: Secondary | ICD-10-CM | POA: Diagnosis present

## 2023-12-16 DIAGNOSIS — E041 Nontoxic single thyroid nodule: Secondary | ICD-10-CM | POA: Diagnosis present

## 2023-12-16 DIAGNOSIS — Z885 Allergy status to narcotic agent status: Secondary | ICD-10-CM

## 2023-12-16 DIAGNOSIS — D72829 Elevated white blood cell count, unspecified: Secondary | ICD-10-CM | POA: Diagnosis present

## 2023-12-16 DIAGNOSIS — Y9301 Activity, walking, marching and hiking: Secondary | ICD-10-CM | POA: Diagnosis present

## 2023-12-16 DIAGNOSIS — Z881 Allergy status to other antibiotic agents status: Secondary | ICD-10-CM | POA: Diagnosis not present

## 2023-12-16 DIAGNOSIS — W109XXA Fall (on) (from) unspecified stairs and steps, initial encounter: Secondary | ICD-10-CM | POA: Diagnosis present

## 2023-12-16 DIAGNOSIS — S0181XA Laceration without foreign body of other part of head, initial encounter: Secondary | ICD-10-CM | POA: Diagnosis present

## 2023-12-16 DIAGNOSIS — I2510 Atherosclerotic heart disease of native coronary artery without angina pectoris: Secondary | ICD-10-CM | POA: Diagnosis present

## 2023-12-16 DIAGNOSIS — D75839 Thrombocytosis, unspecified: Secondary | ICD-10-CM | POA: Diagnosis present

## 2023-12-16 DIAGNOSIS — Z85828 Personal history of other malignant neoplasm of skin: Secondary | ICD-10-CM | POA: Diagnosis not present

## 2023-12-16 DIAGNOSIS — S86811A Strain of other muscle(s) and tendon(s) at lower leg level, right leg, initial encounter: Principal | ICD-10-CM

## 2023-12-16 LAB — CBC
HCT: 40.2 % (ref 39.0–52.0)
Hemoglobin: 13 g/dL (ref 13.0–17.0)
MCH: 28.9 pg (ref 26.0–34.0)
MCHC: 32.3 g/dL (ref 30.0–36.0)
MCV: 89.3 fL (ref 80.0–100.0)
Platelets: 669 K/uL — ABNORMAL HIGH (ref 150–400)
RBC: 4.5 MIL/uL (ref 4.22–5.81)
RDW: 16.5 % — ABNORMAL HIGH (ref 11.5–15.5)
WBC: 13.1 K/uL — ABNORMAL HIGH (ref 4.0–10.5)
nRBC: 0.2 % (ref 0.0–0.2)

## 2023-12-16 LAB — COMPREHENSIVE METABOLIC PANEL WITH GFR
ALT: 22 U/L (ref 0–44)
AST: 25 U/L (ref 15–41)
Albumin: 4.1 g/dL (ref 3.5–5.0)
Alkaline Phosphatase: 41 U/L (ref 38–126)
Anion gap: 10 (ref 5–15)
BUN: 30 mg/dL — ABNORMAL HIGH (ref 8–23)
CO2: 22 mmol/L (ref 22–32)
Calcium: 8.7 mg/dL — ABNORMAL LOW (ref 8.9–10.3)
Chloride: 108 mmol/L (ref 98–111)
Creatinine, Ser: 1.19 mg/dL (ref 0.61–1.24)
GFR, Estimated: >60 mL/min (ref >60)
Glucose, Bld: 96 mg/dL (ref 70–99)
Potassium: 4.2 mmol/L (ref 3.5–5.1)
Sodium: 140 mmol/L (ref 135–145)
Total Bilirubin: 1 mg/dL (ref 0.0–1.2)
Total Protein: 6.6 g/dL (ref 6.5–8.1)

## 2023-12-16 LAB — MAGNESIUM: Magnesium: 2.2 mg/dL (ref 1.7–2.4)

## 2023-12-16 LAB — PROTIME-INR
INR: 1.2 (ref 0.8–1.2)
Prothrombin Time: 15.7 s — ABNORMAL HIGH (ref 11.4–15.2)

## 2023-12-16 MED ORDER — METHOCARBAMOL 500 MG PO TABS
500.0000 mg | ORAL_TABLET | Freq: Three times a day (TID) | ORAL | Status: DC | PRN
  Administered 2023-12-16: 500 mg via ORAL
  Filled 2023-12-16: qty 1

## 2023-12-16 MED ORDER — SODIUM CHLORIDE 0.9% FLUSH
3.0000 mL | Freq: Two times a day (BID) | INTRAVENOUS | Status: DC
  Administered 2023-12-16 – 2023-12-18 (×3): 3 mL via INTRAVENOUS

## 2023-12-16 MED ORDER — HYDROMORPHONE HCL 1 MG/ML IJ SOLN
0.5000 mg | INTRAMUSCULAR | Status: DC | PRN
  Administered 2023-12-16: 0.5 mg via INTRAVENOUS
  Filled 2023-12-16: qty 0.5

## 2023-12-16 MED ORDER — SODIUM CHLORIDE 0.9 % IV BOLUS
1000.0000 mL | Freq: Once | INTRAVENOUS | Status: AC
  Administered 2023-12-16: 1000 mL via INTRAVENOUS

## 2023-12-16 MED ORDER — SENNOSIDES-DOCUSATE SODIUM 8.6-50 MG PO TABS
1.0000 | ORAL_TABLET | Freq: Every evening | ORAL | Status: DC | PRN
Start: 2023-12-16 — End: 2023-12-17

## 2023-12-16 MED ORDER — ACETAMINOPHEN 325 MG PO TABS
650.0000 mg | ORAL_TABLET | Freq: Four times a day (QID) | ORAL | Status: DC | PRN
  Administered 2023-12-16: 650 mg via ORAL
  Filled 2023-12-16: qty 2

## 2023-12-16 MED ORDER — HYDROMORPHONE HCL 1 MG/ML IJ SOLN
0.5000 mg | INTRAMUSCULAR | Status: DC | PRN
  Administered 2023-12-17 (×2): 0.5 mg via INTRAVENOUS
  Filled 2023-12-16 (×2): qty 0.5

## 2023-12-16 MED ORDER — ACETAMINOPHEN 650 MG RE SUPP: 650.0000 mg | Freq: Four times a day (QID) | RECTAL | Status: DC | PRN

## 2023-12-16 MED ORDER — CYCLOBENZAPRINE HCL 10 MG PO TABS
5.0000 mg | ORAL_TABLET | Freq: Once | ORAL | Status: AC
  Administered 2023-12-16: 5 mg via ORAL
  Filled 2023-12-16: qty 1

## 2023-12-16 MED ORDER — HYDROMORPHONE HCL 2 MG PO TABS
2.0000 mg | ORAL_TABLET | ORAL | Status: DC | PRN
  Administered 2023-12-17 (×2): 2 mg via ORAL
  Filled 2023-12-16 (×2): qty 1

## 2023-12-16 MED ORDER — LIDOCAINE HCL (PF) 1 % IJ SOLN
5.0000 mL | Freq: Once | INTRAMUSCULAR | Status: DC
  Filled 2023-12-16: qty 5

## 2023-12-16 MED ORDER — CEFAZOLIN SODIUM-DEXTROSE 3-4 GM/150ML-% IV SOLN
3.0000 g | INTRAVENOUS | Status: AC
  Administered 2023-12-17: 3 g via INTRAVENOUS
  Filled 2023-12-16: qty 150

## 2023-12-16 MED ORDER — ACETAMINOPHEN 500 MG PO TABS
1000.0000 mg | ORAL_TABLET | Freq: Once | ORAL | Status: AC
  Administered 2023-12-16: 1000 mg via ORAL
  Filled 2023-12-16: qty 2

## 2023-12-16 NOTE — ED Triage Notes (Signed)
 C/O tripping on inside stairs and falling down 8 steps,hitting head on door knob.  Laceration to left forehead. C/O dizziness, blurred vision, neck pain.  AAOx3. Skin warm and dry. NAD

## 2023-12-16 NOTE — ED Provider Notes (Signed)
 [facility] Provider Note    Event Date/Time   First MD Initiated Contact with Patient 12/16/23 1550     (approximate)  History   Chief Complaint: Fall  HPI  Anthony Colon is a 73 y.o. male with no significant past medical history who presents to the emergency department after a fall with right knee pain.  According to the patient he tripped while going up stairs causing a fall.  Patient states significant immediate pain to the right knee unable to move the left leg.  Patient also hit his head, no anticoagulants.  No LOC.  Patient suffered a small laceration to the right forehead.  Physical Exam   Triage Vital Signs: ED Triage Vitals  Encounter Vitals Group     BP 12/16/23 1426 99/60     Girls Systolic BP Percentile --      Girls Diastolic BP Percentile --      Boys Systolic BP Percentile --      Boys Diastolic BP Percentile --      Pulse Rate 12/16/23 1429 96     Resp 12/16/23 1426 16     Temp 12/16/23 1426 97.8 F (36.6 C)     Temp Source 12/16/23 1426 Oral     SpO2 12/16/23 1426 96 %     Weight 12/16/23 1425 259 lb 14.8 oz (117.9 kg)     Height --      Head Circumference --      Peak Flow --      Pain Score --      Pain Loc --      Pain Education --      Exclude from Growth Chart --     Most recent vital signs: Vitals:   12/16/23 1530 12/16/23 1600  BP: (!) 142/76 131/83  Pulse: (!) 55 60  Resp:    Temp:    SpO2: 98% 96%    General: Awake, no distress.  Small 2 cm laceration to right forehead, hemostatic. CV:  Good peripheral perfusion.  Regular rate and rhythm  Resp:  Normal effort.  Equal breath sounds bilaterally.  Abd:  No distention.  Soft, nontender.   Other:  Patient has significant amount of swelling to the right knee, tenderness to palpation.  Patient is unable to extend the lower extremity when flexed.  2+ DP pulse.  Sensation intact.  ED Results / Procedures / Treatments   RADIOLOGY  I have reviewed interpret the  CT head images.  No bleed seen on my evaluation. CT cervical spine is negative per radiology. X-ray concerning for distal quadriceps tendon rupture.   MEDICATIONS ORDERED IN ED: Medications - No data to display   IMPRESSION / MDM / ASSESSMENT AND PLAN / ED COURSE  I reviewed the triage vital signs and the nursing notes.  Patient's presentation is most consistent with acute presentation with potential threat to life or bodily function.  Patient presents emergency department after mechanical fall.  Patient has significant right knee pain with right leg swelling.  Patient also hit his head and has a small laceration to the head/right forehead.  CT scan of the head and C-spine appear negative.  X-ray however concerning for high riding patella likely patella tendon rupture.  This also fits the clinical picture as the patient is unable to extend leg when flexed.  Significant tenderness to palpation of the knee.  Significant swelling.  We will discuss with orthopedics for further evaluation.  Patient agreeable to plan  of care.  Does not wish for any pain medication.  Patient's lab work shows a slight leukocytosis on CBC as well as slight thrombocytosis likely reactive.  Chemistry shows no concerning finding.  Chest x-ray is clear.  We will admit to the hospitalist service for further workup and treatment as well as optimization for likely OR tomorrow with Dr. Edie.  Patient agreeable to plan of care.  MRI has been ordered for preop purposes.  FINAL CLINICAL IMPRESSION(S) / ED DIAGNOSES   Right knee pain Patella tendon rupture, right    Note:  This document was prepared using Dragon voice recognition software and may include unintentional dictation errors.   Dorothyann Drivers, MD 12/16/23 1744

## 2023-12-16 NOTE — H&P (Signed)
 History and Physical    Anthony Colon FMW:[id number] DOB: Jun 09, 1950 DOA: 12/16/2023  DOS: the patient was seen and examined on 12/16/2023  PCP: Alla Amis, MD   Patient coming from: Home  I have personally briefly reviewed patient's old medical records in [facility] and CareEverywhere  HPI:   DRAYDEN Colon is a 73 y.o. year old male with medical history of HLD c/b CAD, and past medical hx of nephrolithiasis and BCC presenting to the ED after a mechanical fall resulting in right leg injury.    Pt states he fell while he was walking down the stairs and bent his knee and felt a popping sensation in his knee. He also head his forehead resulting in a cut but denies losing consciousness.    On arrival to the ED patient was noted to be HDS stable. Lab work and imaging were obtained.  CBC shows mild leukocytosis thrombocytosis and CMP shows mild hypocalcemia.  Imaging of right knee with concern for quadricep tendon rupture.  MRI pending.  CT head without any acute abnormality.  CT cervical spine without any acute findings but did note thyroid nodule on the right side at 3.3 cm.  Given tendon rupture orthopedic surgery was consulted and recommended admission for likely OR tomorrow. [facility] contacted for admission.  Review of Systems: As mentioned in the history of present illness. All other systems reviewed and are negative.   Past Medical History:  Diagnosis Date   Basal cell carcinoma 08/02/2014   Right forehead. Superficial.    Dysplastic nevus 04/30/2013   Left posterior shoulder. Moderate atypia, lateral margin involved.    History of kidney stones    Kidney stones     Past Surgical History:  Procedure Laterality Date   CARDIOVERSION N/A 07/31/2018   Procedure: CARDIOVERSION;  Surgeon: Hester Wolm PARAS, MD;  Location: [facility] ORS;  Service: Cardiovascular;  Laterality: N/A;   KNEE SURGERY       Allergies  Allergen Reactions   Ciprofloxacin Swelling     ANGIOEDEMA   Oxycodone  Other (See Comments)    It made the room spin.    No family history on file.  Prior to Admission medications   Medication Sig Start Date End Date Taking? Authorizing Provider  cyclobenzaprine  (FLEXERIL ) 5 MG tablet Take 1-2 tablets (5-10 mg total) by mouth 3 (three) times daily as needed. 07/06/21   Menshew, Candida LULLA Kings, PA-C  mupirocin  ointment (BACTROBAN ) 2 % Apply to skin qd-bid 04/23/23   Hester Alm BROCKS, MD  omeprazole (PRILOSEC) 20 MG capsule Take 20 mg by mouth daily.    [provider]  rosuvastatin (CRESTOR) 5 MG tablet Take by mouth. 09/21/22 09/21/23  [provider]    Social History:  reports that he has never smoked. He has never used smokeless tobacco. He reports that he does not drink alcohol. No history on file for drug use. Tobacco- Denies use. EtOH- Denies use.  Illicit drug use- denies use.  IADLs/ADLs- can perform independently at baseline    Physical Exam: Vitals:   12/16/23 1730 12/16/23 1800 12/16/23 1830 12/16/23 1945  BP: 131/66 139/80  123/79  Pulse: 64 63  75  Resp:    17  Temp:    98.9 F (37.2 C)  TempSrc:    Oral  SpO2: 93% 99% 100% 97%  Weight:    110 kg  Height:    6' 3 (1.905 m)     Physical Exam General: NAD HENT: NCAT Lungs: CTAB,  no wheeze, rhonchi or rales.  Cardiovascular: Normal heart sounds, no r/m/g, 2+ pulses in all extremities. No LE edema Abdomen: No TTP, normal bowel sounds MSK: right knee flexed with ttp. Deferred range of motion due to pain and known pathology.  Skin: no lesions noted on exposed skin Neuro: Alert and oriented x4. CN grossly intact Psych: Normal mood and normal affect    Labs on Admission: I have personally reviewed following labs and imaging studies  CBC: Recent Labs  Lab 12/16/23 1644  WBC 13.1*  HGB 13.0  HCT 40.2  MCV 89.3  PLT 669*   Basic Metabolic Panel: Recent Labs  Lab 12/16/23 1644 12/16/23 2021  NA 140  --   K 4.2  --   CL 108  --    CO2 22  --   GLUCOSE 96  --   BUN 30*  --   CREATININE 1.19  --   CALCIUM 8.7*  --   MG  --  2.2   GFR: Estimated Creatinine Clearance: 76.3 mL/min (by C-G formula based on SCr of 1.19 mg/dL). Liver Function Tests: Recent Labs  Lab 12/16/23 1644  AST 25  ALT 22  ALKPHOS 41  BILITOT 1.0  PROT 6.6  ALBUMIN 4.1   No results for input(s): LIPASE, AMYLASE in the last 168 hours. No results for input(s): AMMONIA in the last 168 hours. Coagulation Profile: Recent Labs  Lab 12/16/23 2021  INR 1.2   Cardiac Enzymes: No results for input(s): CKTOTAL, CKMB, CKMBINDEX, TROPONINI, TROPONINIHS in the last 168 hours. BNP (last 3 results) No results for input(s): BNP in the last 8760 hours. HbA1C: No results for input(s): HGBA1C in the last 72 hours. CBG: No results for input(s): GLUCAP in the last 168 hours. Lipid Profile: No results for input(s): CHOL, HDL, LDLCALC, TRIG, CHOLHDL, LDLDIRECT in the last 72 hours. Thyroid Function Tests: No results for input(s): TSH, T4TOTAL, FREET4, T3FREE, THYROIDAB in the last 72 hours. Anemia Panel: No results for input(s): VITAMINB12, FOLATE, FERRITIN, TIBC, IRON, RETICCTPCT in the last 72 hours. Urine analysis:    Component Value Date/Time   COLORURINE YELLOW (A) 07/27/2015 1408   APPEARANCEUR CLEAR (A) 07/27/2015 1408   APPEARANCEUR Clear 08/06/2011 2136   LABSPEC 1.019 07/27/2015 1408   LABSPEC 1.023 08/06/2011 2136   PHURINE 5.0 07/27/2015 1408   GLUCOSEU NEGATIVE 07/27/2015 1408   GLUCOSEU Negative 08/06/2011 2136   HGBUR 3+ (A) 07/27/2015 1408   BILIRUBINUR NEGATIVE 07/27/2015 1408   BILIRUBINUR Negative 08/06/2011 2136   KETONESUR NEGATIVE 07/27/2015 1408   PROTEINUR NEGATIVE 07/27/2015 1408   NITRITE NEGATIVE 07/27/2015 1408   LEUKOCYTESUR NEGATIVE 07/27/2015 1408   LEUKOCYTESUR 1+ 08/06/2011 2136    Radiological Exams on Admission: I have personally reviewed  images MR KNEE RIGHT WO CONTRAST Result Date: 12/16/2023 CLINICAL DATA:  Surgical planning, quadriceps tendon rupture EXAM: MRI OF THE RIGHT KNEE WITHOUT CONTRAST TECHNIQUE: Multiplanar, multisequence MR imaging of the knee was performed. No intravenous contrast was administered. COMPARISON:  Radiographs 12/16/2023 FINDINGS: MENISCI Medial meniscus: Large radial tear of the posterior horn adjacent to the midbody with free edge irregularity in the remainder of the posterior horn. Lateral meniscus:  Unremarkable LIGAMENTS Cruciates:  Unremarkable Collaterals:  Unremarkable CARTILAGE Patellofemoral: Severe full-thickness chondral thinning laterally in the patellofemoral compartment. Medial: Prominent chondral thinning along much of the medial compartment. Lateral: Mild chondral thinning anteriorly in the lateral compartment. Joint: S knee effusion extends into the suprapatellar bursa and communicates freely through the quadriceps tendon defect  with the prepatellar soft tissues and prepatellar bursa region. Popliteal Fossa: Small amount of infiltrative edema in the popliteal space. Extensor Mechanism: Ruptured distal quadriceps tendon with about 2.9 cm retraction. Extensive edema in the vastus medialis and vastus lateralis muscles which have small intact tendon components contributing to the patellar retinacula. Lax patellar tendon. Bones:  No fracture or acute bony findings. Other: Substantial hemorrhagic prepatellar bursitis with surrounding prepatellar edema. IMPRESSION: 1. Ruptured distal quadriceps tendon with about 2.9 cm retraction. Extensive edema in the vastus medialis and vastus lateralis muscles which have small intact tendon components contributing to the patellar retinacula. 2. Large radial tear of the posterior horn medial meniscus adjacent to the midbody with free edge irregularity in the remainder of the posterior horn. 3. Severe full-thickness chondral thinning laterally in the patellofemoral  compartment. Prominent chondral thinning along much of the medial compartment. Mild chondral thinning anteriorly in the lateral compartment. 4. Substantial hemorrhagic prepatellar bursitis with surrounding prepatellar edema. Electronically Signed   By: Ryan Salvage M.D.   On: 12/16/2023 19:39   DG Chest Portable 1 View Result Date: 12/16/2023 CLINICAL DATA:  Preoperative EXAM: PORTABLE CHEST 1 VIEW COMPARISON:  Chest x-ray 08/06/2011 FINDINGS: The heart size and mediastinal contours are within normal limits. Both lungs are clear. The visualized skeletal structures are unremarkable. IMPRESSION: No active disease. Electronically Signed   By: Greig Pique M.D.   On: 12/16/2023 17:26   CT Head Wo Contrast Result Date: 12/16/2023 EXAM: CT HEAD WITHOUT CONTRAST 12/16/2023 03:21:00 PM TECHNIQUE: CT of the head was performed without the administration of intravenous contrast. Automated exposure control, iterative reconstruction, and/or weight based adjustment of the mA/kV was utilized to reduce the radiation dose to as low as reasonably achievable. COMPARISON: CT head 08/15/2021. CLINICAL HISTORY: Head trauma, minor (Age >= 65y), fall down stairs with forehead laceration, dizziness, blurred vision, and neck pain. FINDINGS: BRAIN AND VENTRICLES: There is no evidence of an acute infarct, intracranial hemorrhage, mass, midline shift, hydrocephalus, or extra-axial fluid collection. Cerebral volume is within normal limits for age. Minimal cerebral white matter hypodensities are nonspecific but compatible with chronic small vessel ischemic disease. ORBITS: No acute abnormality. SINUSES: No acute abnormality. SOFT TISSUES AND SKULL: No acute soft tissue abnormality. No skull fracture. IMPRESSION: 1. No acute intracranial abnormality. Electronically signed by: Dasie Hamburg MD 12/16/2023 04:20 PM EST RP Workstation: [id number]   CT Cervical Spine Wo Contrast Result Date: 12/16/2023 CLINICAL DATA:  Neck trauma (Age  >= 65y) Status post fall. Head injury with laceration, dizziness, blurred vision and neck pain. EXAM: CT CERVICAL SPINE WITHOUT CONTRAST TECHNIQUE: Multidetector CT imaging of the cervical spine was performed without intravenous contrast. Multiplanar CT image reconstructions were also generated. RADIATION DOSE REDUCTION: This exam was performed according to the departmental dose-optimization program which includes automated exposure control, adjustment of the mA and/or kV according to patient size and/or use of iterative reconstruction technique. COMPARISON:  CT cervical spine 08/06/2011. FINDINGS: Alignment: Straightening without focal angulation or listhesis. Skull base and vertebrae: No evidence of acute cervical spine fracture or traumatic subluxation. Multilevel spondylosis has mildly progressed from the remote CT. Soft tissues and spinal canal: No prevertebral fluid or swelling. No visible canal hematoma. Disc levels: Multilevel spondylosis with disc space narrowing, uncinate spurring and facet hypertrophy, most advanced at C5-6 and C6-7. No evidence of large disc herniation. Mild-to-moderate osseous foraminal narrowing at several levels. Upper chest: Incompletely visualized right thyroid nodule, measuring approximately 3.3 x 2.7 cm on image 97/4. Clear lung apices.  Other: Bilateral carotid atherosclerosis. IMPRESSION: 1. No evidence of acute cervical spine fracture, traumatic subluxation or static signs of instability. 2. Multilevel cervical spondylosis as described. 3. Incompletely visualized right thyroid nodule, measuring up to 3.3 cm. Recommend thyroid US .(Ref: J Am Coll Radiol. 2015 Feb;12(2): 143-50). Electronically Signed   By: Elsie Perone M.D.   On: 12/16/2023 15:29   DG Knee Complete 4 Views Right Result Date: 12/16/2023 CLINICAL DATA:  Fall down stairs injuring leg EXAM: RIGHT KNEE - COMPLETE 4+ VIEW COMPARISON:  None Available. FINDINGS: Suspected rupture of the distal quadriceps tendon  with retracted calcifications back 2.7 cm from the patella, extensive surrounding indistinctness of soft tissue planes, prepatellar edema/bursitis, and fluid in the suprapatellar bursa. Mildly lax patellar tendon. MRI could be utilized to further characterize, if clinically warranted. Moderate osteoarthritis with medial compartmental narrowing and marginal spurring in the medial compartment and patellofemoral joint. IMPRESSION: 1. Suspected rupture of the distal quadriceps tendon with retracted calcifications back 2.7 cm from the patella, extensive surrounding indistinctness of soft tissue planes, and fluid in the suprapatellar bursa. This could be further characterized with MRI if clinically warranted. 2. Moderate osteoarthritis. Electronically Signed   By: Ryan Salvage M.D.   On: 12/16/2023 15:21    EKG: My personal interpretation of EKG shows: Pending    Assessment/Plan Principal Problem:   Quadriceps tendon rupture, right, initial encounter Active Problems:   HLD (hyperlipidemia)   CAD (coronary artery disease) Pt with right quadricep tendon rupture after mechanical fall. Orthopedic surgery consulted and plan for OR tomorrow. Pain control regimen in place. Muscle relaxer ordered. Will need PT/OT post op.   HLD/CAD: Continue home statin  Thyroid nodule: TSH and thyroid ultrasound ordered.  VTE prophylaxis:  SCDs  Diet: N.p.o. after midnight Code Status:  Full Code Telemetry:  Admission status: Inpatient, Med-Surg Patient is from: Home Anticipated d/c is to: Home Anticipated d/c is in: 2-3 days   Family Communication: Updated at bedside  Consults called: None   Severity of Illness: The appropriate patient status for this patient is INPATIENT. Inpatient status is judged to be reasonable and necessary in order to provide the required intensity of service to ensure the patient's safety. The patient's presenting symptoms, physical exam findings, and initial radiographic and  laboratory data in the context of their chronic comorbidities is felt to place them at high risk for further clinical deterioration. Furthermore, it is not anticipated that the patient will be medically stable for discharge from the hospital within 2 midnights of admission.   * I certify that at the point of admission it is my clinical judgment that the patient will require inpatient hospital care spanning beyond 2 midnights from the point of admission due to high intensity of service, high risk for further deterioration and high frequency of surveillance required.DEWAINE Morene Bathe, MD [facility]

## 2023-12-16 NOTE — ED Notes (Signed)
 Pt going to MRI at this time. MRI will bring pt to 37 when finished.

## 2023-12-16 NOTE — Consult Note (Signed)
 ORTHOPAEDIC CONSULTATION  REQUESTING PHYSICIAN: Dorothyann Drivers, MD  Chief Complaint:   Right knee pain and swelling.  History of Present Illness: Anthony Colon is a 73 y.o. male with a history of kidney stones and basal cell carcinoma of his forehead who is in otherwise excellent health was in his usual state of health this morning when he apparently tripped and fell while descending a flight of stairs and a new pair of hiking boots.  He feels as though his foot stuck and he pitched forward, hyperflexing his knee.  He felt a popping sensation in his knee and was unable to bear weight.  He presented to his primary care provider who then sent him to the emergency room for further evaluation and treatment.  He also struck his head on a doorknob, sustaining small laceration to the right temple region, but did not lose consciousness.  He denies any lightheadedness, dizziness, chest pain, shortness of breath, or other symptoms which may have precipitated his fall.  Past Medical History:  Diagnosis Date   Basal cell carcinoma 08/02/2014   Right forehead. Superficial.    Dysplastic nevus 04/30/2013   Left posterior shoulder. Moderate atypia, lateral margin involved.    History of kidney stones    Kidney stones    Past Surgical History:  Procedure Laterality Date   CARDIOVERSION N/A 07/31/2018   Procedure: CARDIOVERSION;  Surgeon: Hester Wolm PARAS, MD;  Location: [facility] ORS;  Service: Cardiovascular;  Laterality: N/A;   KNEE SURGERY     Social History   Socioeconomic History   Marital status: Married    Spouse name: Not on file   Number of children: Not on file   Years of education: Not on file   Highest education level: Not on file  Occupational History   Not on file  Tobacco Use   Smoking status: Never   Smokeless tobacco: Never  Vaping Use   Vaping status: Never Used  Substance and Sexual Activity   Alcohol use:  No   Drug use: Not on file   Sexual activity: Not on file  Other Topics Concern   Not on file  Social History Narrative   Not on file   Social Drivers of Health   Financial Resource Strain: Low Risk  (09/17/2023)   Received from [facility]   Overall Financial Resource Strain (CARDIA)    Difficulty of Paying Living Expenses: Not hard at all  Food Insecurity: No Food Insecurity (09/17/2023)   Received from [facility]   Hunger Vital Sign    Within the past 12 months, you worried that your food would run out before you got the money to buy more.: Never true    Within the past 12 months, the food you bought just didn't last and you didn't have money to get more.: Never true  Transportation Needs: No Transportation Needs (09/17/2023)   Received from [facility] - Transportation    In the past 12 months, has lack of transportation kept you from medical appointments or from getting medications?: No    Lack of Transportation (Non-Medical): No  Physical Activity: Not on file  Stress: Not on file  Social Connections: Not on file   No family history on file. Allergies  Allergen Reactions   Ciprofloxacin Swelling    ANGIOEDEMA   Prior to Admission medications   Medication Sig Start Date End Date Taking? Authorizing Provider  cyclobenzaprine  (FLEXERIL ) 5 MG tablet Take 1-2  tablets (5-10 mg total) by mouth 3 (three) times daily as needed. 07/06/21   Menshew, Candida LULLA Kings, PA-C  mupirocin  ointment (BACTROBAN ) 2 % Apply to skin qd-bid 04/23/23   Hester Alm BROCKS, MD  omeprazole (PRILOSEC) 20 MG capsule Take 20 mg by mouth daily.    [provider]  rosuvastatin (CRESTOR) 5 MG tablet Take by mouth. 09/21/22 09/21/23  [provider]   CT Head Wo Contrast Result Date: 12/16/2023 EXAM: CT HEAD WITHOUT CONTRAST 12/16/2023 03:21:00 PM TECHNIQUE: CT of the head was performed without the administration of intravenous  contrast. Automated exposure control, iterative reconstruction, and/or weight based adjustment of the mA/kV was utilized to reduce the radiation dose to as low as reasonably achievable. COMPARISON: CT head 08/15/2021. CLINICAL HISTORY: Head trauma, minor (Age >= 65y), fall down stairs with forehead laceration, dizziness, blurred vision, and neck pain. FINDINGS: BRAIN AND VENTRICLES: There is no evidence of an acute infarct, intracranial hemorrhage, mass, midline shift, hydrocephalus, or extra-axial fluid collection. Cerebral volume is within normal limits for age. Minimal cerebral white matter hypodensities are nonspecific but compatible with chronic small vessel ischemic disease. ORBITS: No acute abnormality. SINUSES: No acute abnormality. SOFT TISSUES AND SKULL: No acute soft tissue abnormality. No skull fracture. IMPRESSION: 1. No acute intracranial abnormality. Electronically signed by: Dasie Hamburg MD 12/16/2023 04:20 PM EST RP Workstation: [id number]   CT Cervical Spine Wo Contrast Result Date: 12/16/2023 CLINICAL DATA:  Neck trauma (Age >= 65y) Status post fall. Head injury with laceration, dizziness, blurred vision and neck pain. EXAM: CT CERVICAL SPINE WITHOUT CONTRAST TECHNIQUE: Multidetector CT imaging of the cervical spine was performed without intravenous contrast. Multiplanar CT image reconstructions were also generated. RADIATION DOSE REDUCTION: This exam was performed according to the departmental dose-optimization program which includes automated exposure control, adjustment of the mA and/or kV according to patient size and/or use of iterative reconstruction technique. COMPARISON:  CT cervical spine 08/06/2011. FINDINGS: Alignment: Straightening without focal angulation or listhesis. Skull base and vertebrae: No evidence of acute cervical spine fracture or traumatic subluxation. Multilevel spondylosis has mildly progressed from the remote CT. Soft tissues and spinal canal: No prevertebral fluid  or swelling. No visible canal hematoma. Disc levels: Multilevel spondylosis with disc space narrowing, uncinate spurring and facet hypertrophy, most advanced at C5-6 and C6-7. No evidence of large disc herniation. Mild-to-moderate osseous foraminal narrowing at several levels. Upper chest: Incompletely visualized right thyroid nodule, measuring approximately 3.3 x 2.7 cm on image 97/4. Clear lung apices. Other: Bilateral carotid atherosclerosis. IMPRESSION: 1. No evidence of acute cervical spine fracture, traumatic subluxation or static signs of instability. 2. Multilevel cervical spondylosis as described. 3. Incompletely visualized right thyroid nodule, measuring up to 3.3 cm. Recommend thyroid US .(Ref: J Am Coll Radiol. 2015 Feb;12(2): 143-50). Electronically Signed   By: Elsie Perone M.D.   On: 12/16/2023 15:29   DG Knee Complete 4 Views Right Result Date: 12/16/2023 CLINICAL DATA:  Fall down stairs injuring leg EXAM: RIGHT KNEE - COMPLETE 4+ VIEW COMPARISON:  None Available. FINDINGS: Suspected rupture of the distal quadriceps tendon with retracted calcifications back 2.7 cm from the patella, extensive surrounding indistinctness of soft tissue planes, prepatellar edema/bursitis, and fluid in the suprapatellar bursa. Mildly lax patellar tendon. MRI could be utilized to further characterize, if clinically warranted. Moderate osteoarthritis with medial compartmental narrowing and marginal spurring in the medial compartment and patellofemoral joint. IMPRESSION: 1. Suspected rupture of the distal quadriceps tendon with retracted calcifications back 2.7 cm from the patella, extensive  surrounding indistinctness of soft tissue planes, and fluid in the suprapatellar bursa. This could be further characterized with MRI if clinically warranted. 2. Moderate osteoarthritis. Electronically Signed   By: Ryan Salvage M.D.   On: 12/16/2023 15:21    Positive ROS: All other systems have been reviewed and were  otherwise negative with the exception of those mentioned in the HPI and as above.  Physical Exam: General:  Alert, no acute distress Psychiatric:  Patient is competent for consent with normal mood and affect   Cardiovascular:  No pedal edema Respiratory:  No wheezing, non-labored breathing GI:  Abdomen is soft and non-tender Skin:  No lesions in the area of chief complaint Neurologic:  Sensation intact distally Lymphatic:  No axillary or cervical lymphadenopathy  Orthopedic Exam:  Orthopedic examination is limited to the right knee and lower extremity.  Skin inspection around the right knee is notable for significant swelling with a 2-3+ effusion.  No erythema, ecchymosis, abrasions, or other skin abnormalities are identified.  He has mild-moderate tenderness to palpation over the superior pole of the patella at the quadriceps tendon insertion site.  There does appear to be a palpable defect in this area.  He has moderate pain with any attempted active or passive motion of the knee.  He has more severe pain with any attempted straight leg raise or quadriceps contracture.  He is unable to perform an active straight leg raise.  He is grossly neurovascularly intact to the right lower extremity and foot.  X-rays:  Recent x-rays of the right knee are available for review and have been reviewed by myself.  The findings are as described above.  Assessment: Acute right quadriceps tendon rupture.  Plan: The treatment options, including both surgical and nonsurgical choices, have been discussed in detail with the patient and his wife who is at the bedside.  The would like to proceed with surgical intervention, specifically a primary repair of the right quadriceps tendon rupture.  The risks (including bleeding, infection, nerve and/or blood vessel injury, persistent or recurrent pain, failure of the repair, recurrence of the tear, stiffness of the knee, progression of his underlying arthritis, need for  further surgery, blood clots, strokes, heart attacks or arrhythmias, pneumonia, etc.) and benefits of the surgical procedure were discussed.  The patient states his understanding and agrees to proceed.  A formal written consent will be obtained by the nursing staff.  Thank you for asked me to participate in the care of this most pleasant yet unfortunate man.  I will be happy to follow him with you.   Anthony Reyes Maltos, MD  Beeper #:  [phone]  12/16/2023 5:28 PM

## 2023-12-17 ENCOUNTER — Inpatient Hospital Stay: Admitting: Anesthesiology

## 2023-12-17 ENCOUNTER — Encounter
Admission: EM | Disposition: A | Discharge status: Home Health | Payer: Self-pay | Source: Home / Self Care | Attending: Internal Medicine

## 2023-12-17 ENCOUNTER — Encounter: Payer: Self-pay | Admitting: Internal Medicine

## 2023-12-17 ENCOUNTER — Inpatient Hospital Stay

## 2023-12-17 DIAGNOSIS — S76111A Strain of right quadriceps muscle, fascia and tendon, initial encounter: Secondary | ICD-10-CM | POA: Diagnosis not present

## 2023-12-17 HISTORY — PX: QUADRICEPS TENDON REPAIR: SHX756

## 2023-12-17 LAB — BASIC METABOLIC PANEL WITH GFR
Anion gap: 9 (ref 5–15)
BUN: 24 mg/dL — ABNORMAL HIGH (ref 8–23)
CO2: 23 mmol/L (ref 22–32)
Calcium: 8.4 mg/dL — ABNORMAL LOW (ref 8.9–10.3)
Chloride: 107 mmol/L (ref 98–111)
Creatinine, Ser: 1.06 mg/dL (ref 0.61–1.24)
GFR, Estimated: >60 mL/min (ref >60)
Glucose, Bld: 110 mg/dL — ABNORMAL HIGH (ref 70–99)
Potassium: 4 mmol/L (ref 3.5–5.1)
Sodium: 139 mmol/L (ref 135–145)

## 2023-12-17 LAB — CBC
HCT: 37.1 % — ABNORMAL LOW (ref 39.0–52.0)
Hemoglobin: 11.9 g/dL — ABNORMAL LOW (ref 13.0–17.0)
MCH: 28.8 pg (ref 26.0–34.0)
MCHC: 32.1 g/dL (ref 30.0–36.0)
MCV: 89.8 fL (ref 80.0–100.0)
Platelets: 662 K/uL — ABNORMAL HIGH (ref 150–400)
RBC: 4.13 MIL/uL — ABNORMAL LOW (ref 4.22–5.81)
RDW: 16.8 % — ABNORMAL HIGH (ref 11.5–15.5)
WBC: 8.2 K/uL (ref 4.0–10.5)
nRBC: 0 % (ref 0.0–0.2)

## 2023-12-17 LAB — GLUCOSE, CAPILLARY: Glucose-Capillary: 110 mg/dL — ABNORMAL HIGH (ref 70–99)

## 2023-12-17 LAB — TSH: TSH: 1.412 u[IU]/mL (ref 0.350–4.500)

## 2023-12-17 SURGERY — REPAIR, TENDON, QUADRICEPS
Anesthesia: General | Site: Knee | Laterality: Right

## 2023-12-17 MED ORDER — FENTANYL CITRATE (PF) 100 MCG/2ML IJ SOLN
INTRAMUSCULAR | Status: AC
  Filled 2023-12-17: qty 2

## 2023-12-17 MED ORDER — ROCURONIUM BROMIDE 10 MG/ML (PF) SYRINGE
PREFILLED_SYRINGE | INTRAVENOUS | Status: AC
  Filled 2023-12-17: qty 10

## 2023-12-17 MED ORDER — BISACODYL 10 MG RE SUPP: 10.0000 mg | Freq: Every day | RECTAL | Status: DC | PRN

## 2023-12-17 MED ORDER — FENTANYL CITRATE (PF) 100 MCG/2ML IJ SOLN
INTRAMUSCULAR | Status: DC | PRN
  Administered 2023-12-17 (×3): 50 ug via INTRAVENOUS

## 2023-12-17 MED ORDER — KETOROLAC TROMETHAMINE 15 MG/ML IJ SOLN: 15.0000 mg | Freq: Once | INTRAMUSCULAR | Status: DC

## 2023-12-17 MED ORDER — ONDANSETRON HCL 4 MG/2ML IJ SOLN
INTRAMUSCULAR | Status: AC
  Filled 2023-12-17: qty 2

## 2023-12-17 MED ORDER — EPHEDRINE SULFATE-NACL 50-0.9 MG/10ML-% IV SOSY
PREFILLED_SYRINGE | INTRAVENOUS | Status: DC | PRN
  Administered 2023-12-17: 10 mg via INTRAVENOUS

## 2023-12-17 MED ORDER — HYDROMORPHONE HCL 1 MG/ML IJ SOLN
1.0000 mg | INTRAMUSCULAR | Status: DC | PRN
  Filled 2023-12-17: qty 1

## 2023-12-17 MED ORDER — 0.9 % SODIUM CHLORIDE (POUR BTL) OPTIME
TOPICAL | Status: DC | PRN
  Administered 2023-12-17: 1000 mL

## 2023-12-17 MED ORDER — BUPIVACAINE LIPOSOME 1.3 % IJ SUSP
INTRAMUSCULAR | Status: AC
  Filled 2023-12-17: qty 10

## 2023-12-17 MED ORDER — PHENYLEPHRINE HCL-NACL 20-0.9 MG/250ML-% IV SOLN
INTRAVENOUS | Status: DC | PRN
  Administered 2023-12-17: 30 ug/min via INTRAVENOUS

## 2023-12-17 MED ORDER — BUPIVACAINE LIPOSOME 1.3 % IJ SUSP
INTRAMUSCULAR | Status: DC | PRN
  Administered 2023-12-17: 10 mL via PERINEURAL

## 2023-12-17 MED ORDER — BUPIVACAINE HCL 0.5 % IJ SOLN
INTRAMUSCULAR | Status: DC | PRN
  Administered 2023-12-17: 30 mL

## 2023-12-17 MED ORDER — PANTOPRAZOLE SODIUM 40 MG PO TBEC
40.0000 mg | DELAYED_RELEASE_TABLET | Freq: Every day | ORAL | Status: DC
  Administered 2023-12-18: 40 mg via ORAL
  Filled 2023-12-17: qty 1

## 2023-12-17 MED ORDER — CEFAZOLIN SODIUM-DEXTROSE 2-4 GM/100ML-% IV SOLN
2.0000 g | Freq: Four times a day (QID) | INTRAVENOUS | Status: AC
  Administered 2023-12-17 – 2023-12-18 (×2): 2 g via INTRAVENOUS
  Filled 2023-12-17 (×2): qty 100

## 2023-12-17 MED ORDER — ACETAMINOPHEN 325 MG PO TABS: 325.0000 mg | ORAL_TABLET | Freq: Four times a day (QID) | ORAL | Status: DC | PRN

## 2023-12-17 MED ORDER — KETAMINE HCL 50 MG/5ML IJ SOSY
PREFILLED_SYRINGE | INTRAMUSCULAR | Status: DC | PRN
  Administered 2023-12-17: 30 mg via INTRAVENOUS

## 2023-12-17 MED ORDER — ROSUVASTATIN CALCIUM 5 MG PO TABS: 5.0000 mg | ORAL_TABLET | Freq: Every day | ORAL | Status: DC

## 2023-12-17 MED ORDER — DIPHENHYDRAMINE HCL 12.5 MG/5ML PO ELIX: 12.5000 mg | ORAL_SOLUTION | ORAL | Status: DC | PRN

## 2023-12-17 MED ORDER — ACETAMINOPHEN 10 MG/ML IV SOLN: 1000.0000 mg | Freq: Once | INTRAVENOUS | Status: DC | PRN

## 2023-12-17 MED ORDER — ACETAMINOPHEN 10 MG/ML IV SOLN
INTRAVENOUS | Status: AC
  Filled 2023-12-17: qty 100

## 2023-12-17 MED ORDER — BUPIVACAINE LIPOSOME 1.3 % IJ SUSP
INTRAMUSCULAR | Status: DC | PRN
  Administered 2023-12-17: 10 mL

## 2023-12-17 MED ORDER — ACETAMINOPHEN 500 MG PO TABS
1000.0000 mg | ORAL_TABLET | Freq: Four times a day (QID) | ORAL | Status: DC
  Administered 2023-12-17 – 2023-12-18 (×3): 1000 mg via ORAL
  Filled 2023-12-17 (×3): qty 2

## 2023-12-17 MED ORDER — METOCLOPRAMIDE HCL 5 MG/ML IJ SOLN: 5.0000 mg | Freq: Three times a day (TID) | INTRAMUSCULAR | Status: DC | PRN

## 2023-12-17 MED ORDER — PHENYLEPHRINE 80 MCG/ML (10ML) SYRINGE FOR IV PUSH (FOR BLOOD PRESSURE SUPPORT)
PREFILLED_SYRINGE | INTRAVENOUS | Status: DC | PRN
  Administered 2023-12-17: 80 ug via INTRAVENOUS
  Administered 2023-12-17: 160 ug via INTRAVENOUS
  Administered 2023-12-17: 80 ug via INTRAVENOUS

## 2023-12-17 MED ORDER — EPHEDRINE SULFATE (PRESSORS) 25 MG/5ML IV SOSY: PREFILLED_SYRINGE | INTRAVENOUS | Status: DC | PRN

## 2023-12-17 MED ORDER — PHENYLEPHRINE HCL-NACL 20-0.9 MG/250ML-% IV SOLN
INTRAVENOUS | Status: AC
  Filled 2023-12-17: qty 250

## 2023-12-17 MED ORDER — ONDANSETRON HCL 4 MG/2ML IJ SOLN
INTRAMUSCULAR | Status: DC | PRN
Start: 2023-12-17 — End: 2023-12-17
  Administered 2023-12-17: 4 mg via INTRAVENOUS

## 2023-12-17 MED ORDER — MIDAZOLAM HCL 2 MG/2ML IJ SOLN
INTRAMUSCULAR | Status: AC
  Filled 2023-12-17: qty 2

## 2023-12-17 MED ORDER — DEXAMETHASONE SOD PHOSPHATE PF 10 MG/ML IJ SOLN
INTRAMUSCULAR | Status: DC | PRN
  Administered 2023-12-17: 10 mg via INTRAVENOUS

## 2023-12-17 MED ORDER — MAGNESIUM HYDROXIDE 400 MG/5ML PO SUSP: 30.0000 mL | Freq: Every day | ORAL | Status: DC | PRN

## 2023-12-17 MED ORDER — FENTANYL CITRATE (PF) 50 MCG/ML IJ SOSY
PREFILLED_SYRINGE | INTRAMUSCULAR | Status: AC
  Filled 2023-12-17: qty 1

## 2023-12-17 MED ORDER — METOCLOPRAMIDE HCL 10 MG PO TABS: 5.0000 mg | ORAL_TABLET | Freq: Three times a day (TID) | ORAL | Status: DC | PRN

## 2023-12-17 MED ORDER — LIDOCAINE HCL (CARDIAC) PF 100 MG/5ML IV SOSY
PREFILLED_SYRINGE | INTRAVENOUS | Status: DC | PRN
  Administered 2023-12-17: 100 mg via INTRAVENOUS

## 2023-12-17 MED ORDER — DOCUSATE SODIUM 100 MG PO CAPS
100.0000 mg | ORAL_CAPSULE | Freq: Two times a day (BID) | ORAL | Status: DC
  Administered 2023-12-17 – 2023-12-18 (×2): 100 mg via ORAL
  Filled 2023-12-17 (×2): qty 1

## 2023-12-17 MED ORDER — ORAL CARE MOUTH RINSE: 15.0000 mL | OROMUCOSAL | Status: DC | PRN

## 2023-12-17 MED ORDER — KETOROLAC TROMETHAMINE 15 MG/ML IJ SOLN
7.5000 mg | Freq: Four times a day (QID) | INTRAMUSCULAR | Status: DC
  Administered 2023-12-17 – 2023-12-18 (×3): 7.5 mg via INTRAVENOUS
  Filled 2023-12-17 (×4): qty 1

## 2023-12-17 MED ORDER — PROPOFOL 10 MG/ML IV BOLUS
INTRAVENOUS | Status: AC
Start: 2023-12-17 — End: 2023-12-17
  Filled 2023-12-17: qty 20

## 2023-12-17 MED ORDER — HYDROMORPHONE HCL 1 MG/ML IJ SOLN: 0.2500 mg | INTRAMUSCULAR | Status: DC | PRN

## 2023-12-17 MED ORDER — ONDANSETRON HCL 4 MG/2ML IJ SOLN: 4.0000 mg | Freq: Four times a day (QID) | INTRAMUSCULAR | Status: DC | PRN

## 2023-12-17 MED ORDER — KETAMINE HCL 50 MG/5ML IJ SOSY
PREFILLED_SYRINGE | INTRAMUSCULAR | Status: AC
  Filled 2023-12-17: qty 5

## 2023-12-17 MED ORDER — BUPIVACAINE HCL (PF) 0.5 % IJ SOLN
INTRAMUSCULAR | Status: DC | PRN
  Administered 2023-12-17: 10 mL via PERINEURAL

## 2023-12-17 MED ORDER — BUPIVACAINE-EPINEPHRINE (PF) 0.5% -1:200000 IJ SOLN
INTRAMUSCULAR | Status: AC
  Filled 2023-12-17: qty 30

## 2023-12-17 MED ORDER — ONDANSETRON HCL 4 MG PO TABS: 4.0000 mg | ORAL_TABLET | Freq: Four times a day (QID) | ORAL | Status: DC | PRN

## 2023-12-17 MED ORDER — ACETAMINOPHEN 10 MG/ML IV SOLN
INTRAVENOUS | Status: DC | PRN
  Administered 2023-12-17: 1000 mg via INTRAVENOUS

## 2023-12-17 MED ORDER — SODIUM CHLORIDE 0.9 % IV SOLN: INTRAVENOUS | Status: DC

## 2023-12-17 MED ORDER — ROSUVASTATIN CALCIUM 5 MG PO TABS
5.0000 mg | ORAL_TABLET | Freq: Every day | ORAL | Status: DC
  Administered 2023-12-17: 5 mg via ORAL
  Filled 2023-12-17 (×2): qty 1

## 2023-12-17 MED ORDER — LACTATED RINGERS IV SOLN: INTRAVENOUS | Status: DC | PRN

## 2023-12-17 MED ORDER — FLEET ENEMA RE ENEM: 1.0000 | ENEMA | Freq: Once | RECTAL | Status: DC | PRN

## 2023-12-17 MED ORDER — MIDAZOLAM HCL (PF) 2 MG/2ML IJ SOLN
INTRAMUSCULAR | Status: DC | PRN
  Administered 2023-12-17: 2 mg via INTRAVENOUS

## 2023-12-17 MED ORDER — ENOXAPARIN SODIUM 60 MG/0.6ML IJ SOSY
55.0000 mg | PREFILLED_SYRINGE | Freq: Every day | INTRAMUSCULAR | Status: DC
  Filled 2023-12-17: qty 0.6

## 2023-12-17 MED ORDER — PROPOFOL 10 MG/ML IV BOLUS
INTRAVENOUS | Status: DC | PRN
  Administered 2023-12-17: 200 mg via INTRAVENOUS

## 2023-12-17 MED ORDER — DROPERIDOL 2.5 MG/ML IJ SOLN: 0.6250 mg | Freq: Once | INTRAMUSCULAR | Status: DC | PRN

## 2023-12-17 SURGICAL SUPPLY — 46 items
ANCHOR SUT 5.0 TWINFIX 2 ULBRD (Anchor) IMPLANT
BLADE SURG SZ10 CARB STEEL (BLADE) ×2 IMPLANT
BNDG COHESIVE 4X5 TAN STRL LF (GAUZE/BANDAGES/DRESSINGS) ×1 IMPLANT
BNDG ESMARCH 6X12 STRL LF (GAUZE/BANDAGES/DRESSINGS) ×1 IMPLANT
BRACE KNEE POST OP SHORT (BRACE) IMPLANT
CHLORAPREP W/TINT 26 (MISCELLANEOUS) ×1 IMPLANT
CUFF TRNQT CYL 24X4X16.5-23 (TOURNIQUET CUFF) IMPLANT
CUFF TRNQT CYL 34X4.125X (TOURNIQUET CUFF) IMPLANT
DRAPE INCISE IOBAN 66X45 STRL (DRAPES) ×1 IMPLANT
DRAPE SHEET LG 3/4 BI-LAMINATE (DRAPES) ×1 IMPLANT
DRAPE SURG 17X11 SM STRL (DRAPES) ×2 IMPLANT
DRAPE SURG ORHT 6 SPLT 77X108 (DRAPES) ×2 IMPLANT
DRSG OPSITE POSTOP 4X10 (GAUZE/BANDAGES/DRESSINGS) IMPLANT
DRSG OPSITE POSTOP 4X12 (GAUZE/BANDAGES/DRESSINGS) ×1 IMPLANT
DRSG XEROFORM 1X8 (GAUZE/BANDAGES/DRESSINGS) IMPLANT
ELECTRODE REM PT RTRN 9FT ADLT (ELECTROSURGICAL) ×1 IMPLANT
GAUZE SPONGE 4X4 12PLY STRL (GAUZE/BANDAGES/DRESSINGS) ×1 IMPLANT
GLOVE BIO SURGEON STRL SZ8 (GLOVE) ×2 IMPLANT
GLOVE BIOGEL PI IND STRL 8 (GLOVE) ×1 IMPLANT
GOWN STRL REUS W/ TWL LRG LVL3 (GOWN DISPOSABLE) ×2 IMPLANT
GOWN STRL REUS W/ TWL XL LVL3 (GOWN DISPOSABLE) ×1 IMPLANT
HANDLE YANKAUER SUCT BULB TIP (MISCELLANEOUS) ×1 IMPLANT
IMMBOLIZER KNEE 19 BLUE UNIV (SOFTGOODS) ×1 IMPLANT
KIT TURNOVER KIT A (KITS) ×1 IMPLANT
MANIFOLD NEPTUNE II (INSTRUMENTS) ×1 IMPLANT
NDL MAYO CATGUT SZ1 (NEEDLE) ×1 IMPLANT
NDL SUT 5 .5 CRC TROC PNT MAYO (NEEDLE) IMPLANT
NEEDLE MAYO CATGUT SZ1 (NEEDLE) ×1 IMPLANT
PACK EXTREMITY ARMC (MISCELLANEOUS) ×1 IMPLANT
PAD COLD UNI XL WRAP-ON (PAD) IMPLANT
PENCIL SMOKE EVACUATOR (MISCELLANEOUS) ×1 IMPLANT
RETRIEVER SUT HEWSON (MISCELLANEOUS) ×1 IMPLANT
SOLN 0.9% NACL POUR BTL 1000ML (IV SOLUTION) ×1 IMPLANT
SPONGE T-LAP 18X18 ~~LOC~~+RFID (SPONGE) ×1 IMPLANT
STAPLER SKIN PROX 35W (STAPLE) ×1 IMPLANT
STOCKINETTE IMPERVIOUS 9X36 MD (GAUZE/BANDAGES/DRESSINGS) ×1 IMPLANT
SUT ETHIBOND #5 BRAIDED 30INL (SUTURE) ×1 IMPLANT
SUT ETHIBOND 2 V 37 (SUTURE) IMPLANT
SUT VIC AB 0 CT1 36 (SUTURE) ×2 IMPLANT
SUT VIC AB 2-0 CT1 (SUTURE) IMPLANT
SUT VIC AB 2-0 CT1 TAPERPNT 27 (SUTURE) ×2 IMPLANT
SUT VIC AB 2-0 CT2 27 (SUTURE) ×2 IMPLANT
SUT VIC AB 3-0 SH 27X BRD (SUTURE) ×2 IMPLANT
SUTURE FIBERWR #2 38 BLUE 1/2 (SUTURE) IMPLANT
TRAP FLUID SMOKE EVACUATOR (MISCELLANEOUS) ×1 IMPLANT
WATER STERILE IRR 500ML POUR (IV SOLUTION) ×1 IMPLANT

## 2023-12-17 NOTE — Anesthesia Procedure Notes (Addendum)
 Procedure Name: LMA Insertion Date/Time: 12/17/2023 1:56 PM  Performed by: Estelle Merck, Hipolito, RNPre-anesthesia Checklist: Patient identified, Emergency Drugs available, Suction available and Patient being monitored Patient Re-evaluated:Patient Re-evaluated prior to induction Oxygen Delivery Method: Circle system utilized Preoxygenation: Pre-oxygenation with 100% oxygen Induction Type: IV induction Ventilation: Mask ventilation without difficulty LMA: LMA inserted LMA Size: 5.0 Number of attempts: 2 Placement Confirmation: positive ETCO2, CO2 detector and breath sounds checked- equal and bilateral Tube secured with: Tape Comments: Atraumatic

## 2023-12-17 NOTE — Anesthesia Procedure Notes (Signed)
 Anesthesia Regional Block: Femoral nerve block   Pre-Anesthetic Checklist: , timeout performed,  Correct Patient, Correct Site, Correct Laterality,  Correct Procedure, Correct Position, site marked,  Risks and benefits discussed,  Surgical consent,  Pre-op evaluation,  At surgeon's request and post-op pain management  Laterality: Right  Prep: chloraprep       Needles:  Injection technique: Single-shot  Needle Type: Stimiplex     Needle Length: 9cm  Needle Gauge: 22     Additional Needles:   Procedures:,,,, ultrasound used (permanent image in chart),,    Narrative:  Start time: 12/17/2023 1:45 PM End time: 12/17/2023 1:48 PM Injection made incrementally with aspirations every 5 mL.  Performed by: Personally  Anesthesiologist: Vicci Camellia Glatter, MD  Additional Notes: Patient consented for risk and benefits of nerve block including but not limited to nerve damage, failed block, bleeding and infection.  Patient voiced understanding.  Functioning IV was confirmed and monitors were applied.  Timeout done prior to procedure and prior to any sedation being given to the patient.  Patient confirmed procedure site prior to any sedation given to the patient. Sterile prep,hand hygiene and sterile gloves were used.  Minimal sedation used for procedure.  No paresthesia endorsed by patient during the procedure.  Negative aspiration and negative test dose prior to incremental administration of local anesthetic. The patient tolerated the procedure well with no immediate complications.

## 2023-12-17 NOTE — Transfer of Care (Signed)
 Immediate Anesthesia Transfer of Care Note  Patient: Anthony Colon  Procedure(s) Performed: REPAIR, TENDON, QUADRICEPS (Right: Knee)  Patient Location: PACU  Anesthesia Type:General and Regional  Level of Consciousness: sedated  Airway & Oxygen Therapy: Patient Spontanous Breathing and Patient connected to face mask  Post-op Assessment: Report given to RN, Post -op Vital signs reviewed and stable, and Patient moving all extremities  Post vital signs: Reviewed and stable  Last Vitals:  Vitals Value Taken Time  BP 112/54 12/17/23 16:15  Temp    Pulse 73 12/17/23 16:20  Resp 16 12/17/23 16:20  SpO2 95 % 12/17/23 16:20  Vitals shown include unfiled device data.  Last Pain:  Vitals:   12/17/23 1324  TempSrc: Temporal  PainSc: 0-No pain         Complications: No notable events documented.

## 2023-12-17 NOTE — Progress Notes (Signed)
 PROGRESS NOTE  Anthony Colon  FMW:[id number] DOB: 09-10-1950 DOA: 12/16/2023 PCP: Alla Amis, MD   Anthony Colon is a 73 year old male with history of CAD, hyperlipidemia, presents to the ED for chief concerns of right knee pain after a fall.  Patient was admitted to hospitalist service for quadricep tendon repair.  Labs on my evaluation showed serum sodium 139, potassium 4.0, chloride 107, bicarb 23, BUN of 24, serum creatinine 1.06, eGFR greater than 60, nonfasting blood glucose 110, WBC 8.2, hemoglobin 11.9, platelets of 662.    11/10: Magnesium level is 2.2.  TSH was within normal limits.  11/10: Patient admitted to hospital service.  11/11: Patient remains n.p.o. pending orthopedic tendon repair.  He reports decreased range of motion of the right lower extremity.  He reports otherwise no acute distress.  PT, OT consulted for initiating on 11/12.  Assessment & Plan:   Principal Problem:   Quadriceps tendon rupture, right, initial encounter Active Problems:   HLD (hyperlipidemia)   CAD (coronary artery disease)    Assessment and Plan: * Quadriceps tendon rupture, right, initial encounter Right quadricep tendon repair per orthopedic service Continue symptomatic pain support PT, OT tomorrow  HLD (hyperlipidemia) Home rosuvastatin 5 mg daily resumed  DVT prophylaxis: Enoxaparin Code Status: Full code Family Communication: Updated family including spouse and daughter and grandchildren at bedside Disposition Plan: Pending clinical course, orthopedic evaluation and intervention Level of care: Med-Surg  Consultants:  Orthopedic service, PT, OT  Procedures:  Tendon repair, right quadricep  Antimicrobials: None  Subjective:  At bedside, patient alert and oriented to self, age, location, current calendar year.  Objective: Vitals:   12/17/23 0408 12/17/23 0740 12/17/23 1324 12/17/23 1330  BP: 124/71 128/65 (!) 119/59 125/61  Pulse: 65 69 71 68   Resp: 16 16 15 11   Temp: 98.3 F (36.8 C) 98 F (36.7 C) 98.1 F (36.7 C)   TempSrc:   Temporal   SpO2: 95% 97% 92% 94%  Weight: 112.1 kg  112.1 kg   Height:   6' 3 (1.905 m)     Intake/Output Summary (Last 24 hours) at 12/17/2023 1544 Last data filed at 12/17/2023 1538 Gross per 24 hour  Intake 600 ml  Output --  Net 600 ml   Filed Weights   12/16/23 1945 12/17/23 0408 12/17/23 1324  Weight: 110 kg 112.1 kg 112.1 kg   Examination:  General exam: Appears calm and comfortable  Respiratory system: Clear to auscultation. Respiratory effort normal. Cardiovascular system: S1 & S2 heard, RRR. No JVD, murmurs, rubs, gallops or clicks. No pedal edema. Gastrointestinal system: Abdomen is nondistended, soft and nontender. No organomegaly or masses felt. Normal bowel sounds heard. Central nervous system: Alert and oriented. No focal neurological deficits. Extremities: Decreased range of motion of the right lower extremity Skin: No rashes, lesions or ulcers Psychiatry: Judgement and insight appear normal. Mood & affect appropriate.   Data Reviewed: I have personally reviewed following labs and imaging studies  CBC: Recent Labs  Lab 12/16/23 1644 12/17/23 0508  WBC 13.1* 8.2  HGB 13.0 11.9*  HCT 40.2 37.1*  MCV 89.3 89.8  PLT 669* 662*   Basic Metabolic Panel: Recent Labs  Lab 12/16/23 1644 12/16/23 2021 12/17/23 0508  NA 140  --  139  K 4.2  --  4.0  CL 108  --  107  CO2 22  --  23  GLUCOSE 96  --  110*  BUN 30*  --  24*  CREATININE 1.19  --  1.06  CALCIUM 8.7*  --  8.4*  MG  --  2.2  --    GFR: Estimated Creatinine Clearance: 86.3 mL/min (by C-G formula based on SCr of 1.06 mg/dL).  Liver Function Tests: Recent Labs  Lab 12/16/23 1644  AST 25  ALT 22  ALKPHOS 41  BILITOT 1.0  PROT 6.6  ALBUMIN 4.1   Coagulation Profile: Recent Labs  Lab 12/16/23 2021  INR 1.2   CBG: Recent Labs  Lab 12/17/23 0813  GLUCAP 110*   Thyroid Function  Tests: Recent Labs    12/16/23 2021  TSH 1.412   Radiology Studies: DG Wrist Complete Right Result Date: 12/17/2023 CLINICAL DATA:  Acute pain of right wrist.  Fall yesterday. EXAM: DG WRIST COMPLETE 3+V*R* COMPARISON:  None Available. FINDINGS: The mineralization and alignment are normal. On the lateral view, there is a probable small avulsion fracture posteriorly, likely arising from the triquetrum. No other evidence of acute fracture or dislocation. There is posterior soft tissue swelling. Mild degenerative changes at the 1st carpometacarpal joint with mild intraosseous cyst formation in the lunate. IV tubing in place. IMPRESSION: Probable small avulsion fracture posteriorly, likely arising from the triquetrum. Associated posterior soft tissue swelling. Electronically Signed   By: Elsie Perone M.D.   On: 12/17/2023 15:11   US  OR NERVE BLOCK-IMAGE ONLY [facility]) Result Date: 12/17/2023 There is no interpretation for this exam.  This order is for images obtained during a surgical procedure.  Please See Surgeries Tab for more information regarding the procedure.   US  THYROID Result Date: 12/17/2023 CLINICAL DATA:  Incidental on CT. EXAM: THYROID ULTRASOUND TECHNIQUE: Ultrasound examination of the thyroid gland and adjacent soft tissues was performed. COMPARISON:  12/16/2023 FINDINGS: Parenchymal Echotexture: Mildly heterogeneous Isthmus: 0.8 cm Right lobe: 5.2 x 3.0 x 3.4 cm Left lobe: 1.3 x 0.7 x 0.7 cm ________________________________________________________ Estimated total number of nodules >/= 1 cm: 2 Number of spongiform nodules >/=  2 cm not described below (TR1): 0 Number of mixed cystic and solid nodules >/= 1.5 cm not described below (TR2): 0 _________________________________________________________ Nodule # 1: Location: Isthmus; Superior Maximum size: 2.1 cm; Other 2 dimensions: 1.1 x 2.1 cm Composition: mixed cystic and solid (1) Echogenicity: isoechoic (1) Shape: not taller-than-wide  (0) Margins: smooth (0) Echogenic foci: none (0) ACR TI-RADS total points: 2. ACR TI-RADS risk category: TR2 (2 points). ACR TI-RADS recommendations: This nodule does NOT meet TI-RADS criteria for biopsy or dedicated follow-up. _________________________________________________________ Nodule # 2: Location: Right; Mid Maximum size: 3.6 cm; Other 2 dimensions: 3.1 x 3.5 cm Composition: mixed cystic and solid (1) Echogenicity: isoechoic (1) Shape: not taller-than-wide (0) Margins: smooth (0) Echogenic foci: none (0) ACR TI-RADS total points: 2. ACR TI-RADS risk category: TR2 (2 points). ACR TI-RADS recommendations: This nodule does NOT meet TI-RADS criteria for biopsy or dedicated follow-up. _________________________________________________________ No cervical lymphadenopathy. IMPRESSION: Benign-appearing multinodular thyroid. The visualized isthmus and right thyroid nodules do not meet criteria for dedicated follow-up. The above is in keeping with the ACR TI-RADS recommendations - J Am Coll Radiol 2017;14:587-595. Ester Sides, MD [facility] Radiology Electronically Signed   By: Ester Sides M.D.   On: 12/17/2023 11:22   MR KNEE RIGHT WO CONTRAST Result Date: 12/16/2023 CLINICAL DATA:  Surgical planning, quadriceps tendon rupture EXAM: MRI OF THE RIGHT KNEE WITHOUT CONTRAST TECHNIQUE: Multiplanar, multisequence MR imaging of the knee was performed. No intravenous contrast was administered. COMPARISON:  Radiographs 12/16/2023 FINDINGS: MENISCI Medial meniscus: Large radial  tear of the posterior horn adjacent to the midbody with free edge irregularity in the remainder of the posterior horn. Lateral meniscus:  Unremarkable LIGAMENTS Cruciates:  Unremarkable Collaterals:  Unremarkable CARTILAGE Patellofemoral: Severe full-thickness chondral thinning laterally in the patellofemoral compartment. Medial: Prominent chondral thinning along much of the medial compartment.  Lateral: Mild chondral thinning anteriorly in the lateral compartment. Joint: S knee effusion extends into the suprapatellar bursa and communicates freely through the quadriceps tendon defect with the prepatellar soft tissues and prepatellar bursa region. Popliteal Fossa: Small amount of infiltrative edema in the popliteal space. Extensor Mechanism: Ruptured distal quadriceps tendon with about 2.9 cm retraction. Extensive edema in the vastus medialis and vastus lateralis muscles which have small intact tendon components contributing to the patellar retinacula. Lax patellar tendon. Bones:  No fracture or acute bony findings. Other: Substantial hemorrhagic prepatellar bursitis with surrounding prepatellar edema. IMPRESSION: 1. Ruptured distal quadriceps tendon with about 2.9 cm retraction. Extensive edema in the vastus medialis and vastus lateralis muscles which have small intact tendon components contributing to the patellar retinacula. 2. Large radial tear of the posterior horn medial meniscus adjacent to the midbody with free edge irregularity in the remainder of the posterior horn. 3. Severe full-thickness chondral thinning laterally in the patellofemoral compartment. Prominent chondral thinning along much of the medial compartment. Mild chondral thinning anteriorly in the lateral compartment. 4. Substantial hemorrhagic prepatellar bursitis with surrounding prepatellar edema. Electronically Signed   By: Ryan Salvage M.D.   On: 12/16/2023 19:39   DG Chest Portable 1 View Result Date: 12/16/2023 CLINICAL DATA:  Preoperative EXAM: PORTABLE CHEST 1 VIEW COMPARISON:  Chest x-ray 08/06/2011 FINDINGS: The heart size and mediastinal contours are within normal limits. Both lungs are clear. The visualized skeletal structures are unremarkable. IMPRESSION: No active disease. Electronically Signed   By: Greig Pique M.D.   On: 12/16/2023 17:26   CT Head Wo Contrast Result Date: 12/16/2023 EXAM: CT HEAD WITHOUT  CONTRAST 12/16/2023 03:21:00 PM TECHNIQUE: CT of the head was performed without the administration of intravenous contrast. Automated exposure control, iterative reconstruction, and/or weight based adjustment of the mA/kV was utilized to reduce the radiation dose to as low as reasonably achievable. COMPARISON: CT head 08/15/2021. CLINICAL HISTORY: Head trauma, minor (Age >= 65y), fall down stairs with forehead laceration, dizziness, blurred vision, and neck pain. FINDINGS: BRAIN AND VENTRICLES: There is no evidence of an acute infarct, intracranial hemorrhage, mass, midline shift, hydrocephalus, or extra-axial fluid collection. Cerebral volume is within normal limits for age. Minimal cerebral white matter hypodensities are nonspecific but compatible with chronic small vessel ischemic disease. ORBITS: No acute abnormality. SINUSES: No acute abnormality. SOFT TISSUES AND SKULL: No acute soft tissue abnormality. No skull fracture. IMPRESSION: 1. No acute intracranial abnormality. Electronically signed by: Dasie Hamburg MD 12/16/2023 04:20 PM EST RP Workstation: [id number]   CT Cervical Spine Wo Contrast Result Date: 12/16/2023 CLINICAL DATA:  Neck trauma (Age >= 65y) Status post fall. Head injury with laceration, dizziness, blurred vision and neck pain. EXAM: CT CERVICAL SPINE WITHOUT CONTRAST TECHNIQUE: Multidetector CT imaging of the cervical spine was performed without intravenous contrast. Multiplanar CT image reconstructions were also generated. RADIATION DOSE REDUCTION: This exam was performed according to the departmental dose-optimization program which includes automated exposure control, adjustment of the mA and/or kV according to patient size and/or use of iterative reconstruction technique. COMPARISON:  CT cervical spine 08/06/2011. FINDINGS: Alignment: Straightening without focal angulation or listhesis. Skull base and vertebrae: No evidence of acute cervical spine  fracture or traumatic subluxation.  Multilevel spondylosis has mildly progressed from the remote CT. Soft tissues and spinal canal: No prevertebral fluid or swelling. No visible canal hematoma. Disc levels: Multilevel spondylosis with disc space narrowing, uncinate spurring and facet hypertrophy, most advanced at C5-6 and C6-7. No evidence of large disc herniation. Mild-to-moderate osseous foraminal narrowing at several levels. Upper chest: Incompletely visualized right thyroid nodule, measuring approximately 3.3 x 2.7 cm on image 97/4. Clear lung apices. Other: Bilateral carotid atherosclerosis. IMPRESSION: 1. No evidence of acute cervical spine fracture, traumatic subluxation or static signs of instability. 2. Multilevel cervical spondylosis as described. 3. Incompletely visualized right thyroid nodule, measuring up to 3.3 cm. Recommend thyroid US .(Ref: J Am Coll Radiol. 2015 Feb;12(2): 143-50). Electronically Signed   By: Elsie Perone M.D.   On: 12/16/2023 15:29   DG Knee Complete 4 Views Right Result Date: 12/16/2023 CLINICAL DATA:  Fall down stairs injuring leg EXAM: RIGHT KNEE - COMPLETE 4+ VIEW COMPARISON:  None Available. FINDINGS: Suspected rupture of the distal quadriceps tendon with retracted calcifications back 2.7 cm from the patella, extensive surrounding indistinctness of soft tissue planes, prepatellar edema/bursitis, and fluid in the suprapatellar bursa. Mildly lax patellar tendon. MRI could be utilized to further characterize, if clinically warranted. Moderate osteoarthritis with medial compartmental narrowing and marginal spurring in the medial compartment and patellofemoral joint. IMPRESSION: 1. Suspected rupture of the distal quadriceps tendon with retracted calcifications back 2.7 cm from the patella, extensive surrounding indistinctness of soft tissue planes, and fluid in the suprapatellar bursa. This could be further characterized with MRI if clinically warranted. 2. Moderate osteoarthritis. Electronically Signed   By:  Ryan Salvage M.D.   On: 12/16/2023 15:21   Scheduled Meds:  [MAR Hold] lidocaine  (PF)  5 mL Infiltration Once   [MAR Hold] rosuvastatin  5 mg Oral Daily   [MAR Hold] sodium chloride  flush  3 mL Intravenous Q12H   Continuous Infusions:   LOS: 1 day   Time spent: 35 minutes  Dr. Sherre [facility] If 7PM-7AM, please contact night-coverage 12/17/2023, 3:44 PM

## 2023-12-17 NOTE — Anesthesia Procedure Notes (Addendum)
 Procedure Name: LMA Insertion Date/Time: 12/17/2023 1:59 PM  Performed by: Estelle Merck, Hipolito, RNPre-anesthesia Checklist: Patient identified, Emergency Drugs available, Suction available and Patient being monitored Patient Re-evaluated:Patient Re-evaluated prior to induction Oxygen Delivery Method: Circle system utilized Preoxygenation: Pre-oxygenation with 100% oxygen Induction Type: IV induction Ventilation: Mask ventilation without difficulty LMA Size: 4.0 Number of attempts: 2 Placement Confirmation: positive ETCO2, CO2 detector and breath sounds checked- equal and bilateral Tube secured with: Tape Comments: Atraumatic, LMA was exchanged from a size 5 to a size 4. Constant leak and low tidal volumes with size 5. Size 5 LMA manipulation was unsuccessful. Size 4 was inserted with no more leaks and good tidal/minute volume ventilation. Pt will be monitored closely.

## 2023-12-17 NOTE — Assessment & Plan Note (Signed)
 Right quadricep tendon repair per orthopedic service Continue symptomatic pain support PT, OT tomorrow

## 2023-12-17 NOTE — Op Note (Signed)
 12/17/2023  4:23 PM  Patient:   Anthony Colon  Pre-Op Diagnosis:   Acute quadriceps tendon rupture, right knee.  Post-Op Diagnosis:   Same.  Procedure:   Primary repair of right quadriceps tendon rupture.  Surgeon:   DOROTHA Reyes Maltos, MD  Assistant:   Vick Dunk, PA-C; HILLARY Hummer, PA-S  Anesthesia:   General LMA with femoral nerve block using Exparel placed preoperatively by anesthesia  Findings:   As above.  Complications:   None  Fluids:   700 cc crystalloid  EBL:   100 cc  TT:   100 minutes at 300 mmHg  Drains:   None  Closure:   Staples  Implants:   [employer]hew 5.0 mm titanium corkscrew anchor 3  Brief Clinical Note:   The patient is a 73 year old male who sustained above-noted injury yesterday afternoon when he tripped while descending a flight of stairs, hyperflexing his knee. He was brought to the emergency room where x-rays and subsequent MRI scanning demonstrated the above-noted injury. The patient was admitted and presents at this time for definitive management of this injury.  Procedure:   The patient underwent the placement of a femoral nerve block using Exparel in the preoperative holding area by the anesthesiologist before being brought into the operating room and lain in the supine position. After adequate general laryngeal mask anesthesia were obtained, the patient's right lower extremity was prepped with ChloraPrep solution before being draped sterilely. Preoperative antibiotics were administered. A timeout was performed to verify the appropriate surgical site before the limb was exsanguinated with an Esmarch and the thigh tourniquet inflated to 300 mmHg.   An approximately 4-5 inch incision was made over the anterior aspect of the knee centered at the superior pole of the patella. The incision was carried down through the subcutaneous tissues to expose the superficial retinaculum. This was split the length of the incision to reveal the ruptured  quadriceps tendon. The torn end of the tendon was debrided sharply back to good tissues while the attachment site at the superior pole of the patella was debrided down to bleeding bone with a rongeur. Three Smith [employer] Nephew 5 mm titanium corkscrew anchors were inserted into the proximal pole of the patella after predrilling with a 4 mm drill. One limb from each of the 6 sets of sutures were woven through the quadriceps tendon in a Krakw fashion before being tied securely. Several of the sutures were then brought back distally and secured to the medial and lateral retinacular tissues respectively to help reinforce this repair. The repair was deemed stable with knee flexion to 70. Each of the three fiber tapes were brought back through the distal quadriceps tendon using free needles and tied again to reinforce this repair. Finally, the torn portions of the retinaculum medially and laterally were re-approximated using #2 FiberWire interrupted sutures. Again knee flexion was assessed. The repair was shown to be stable with knee flexion to 75, supporting the weight of the lower leg independently in the process.  The wound was copiously irrigated with sterile saline solution using bulb irrigation before the superficial retinacular layer was re-approximated using #0 Vicryl interrupted sutures. The subcutaneous tissues were closed in two layers using 2-0 Vicryl interrupted sutures before the skin was closed using staples. A sterile occlusive dressing was applied to the knee, incorporating a cooling pad for the Polar Care device, before the leg was placed into a postoperative hinged knee brace with the hinges set at 0-10, but locked  in extension. The patient was then awakened, extubated, and returned to the recovery room in satisfactory condition after tolerating the procedure well.

## 2023-12-17 NOTE — Hospital Course (Signed)
 Mr. Anthony Colon is a 73 year old male with history of CAD, hyperlipidemia, presents to the ED for chief concerns of right knee pain after a fall.  Patient was admitted to hospitalist service for quadricep tendon repair.  Labs on my evaluation showed serum sodium 139, potassium 4.0, chloride 107, bicarb 23, BUN of 24, serum creatinine 1.06, eGFR greater than 60, nonfasting blood glucose 110, WBC 8.2, hemoglobin 11.9, platelets of 662.    11/10: Magnesium level is 2.2.  TSH was within normal limits.  11/10: Patient admitted to hospital service.  11/11: Patient remains n.p.o. pending orthopedic tendon repair.  He reports decreased range of motion of the right lower extremity.  He reports otherwise no acute distress.  PT, OT consulted for initiating on 11/12.

## 2023-12-17 NOTE — Anesthesia Postprocedure Evaluation (Signed)
 Anesthesia Post Note  Patient: Anthony Colon  Procedure(s) Performed: REPAIR, TENDON, QUADRICEPS (Right: Knee)  Patient location during evaluation: PACU Anesthesia Type: General Level of consciousness: awake and alert Pain management: pain level controlled Vital Signs Assessment: post-procedure vital signs reviewed and stable Respiratory status: spontaneous breathing, nonlabored ventilation and respiratory function stable Cardiovascular status: blood pressure returned to baseline and stable Postop Assessment: no apparent nausea or vomiting Anesthetic complications: no   No notable events documented.   Last Vitals:  Vitals:   12/17/23 1711 12/17/23 2101  BP: 119/64 119/71  Pulse: 75 72  Resp: 18 17  Temp: 36.9 C 36.9 C  SpO2: 97% 93%    Last Pain:  Vitals:   12/17/23 2153  TempSrc:   PainSc: 6                  Camellia Merilee Louder

## 2023-12-17 NOTE — Plan of Care (Signed)

## 2023-12-17 NOTE — Consult Note (Signed)
 PHARMACIST - PHYSICIAN COMMUNICATION  CONCERNING:  Enoxaparin (Lovenox) for DVT Prophylaxis    RECOMMENDATION: Patient was prescribed enoxaprin 40mg  q24 hours for VTE prophylaxis.   Filed Weights   12/16/23 1945 12/17/23 0408 12/17/23 1324  Weight: 110 kg (242 lb 8.1 oz) 112.1 kg (247 lb 2.2 oz) 112.1 kg (247 lb 2.2 oz)    Body mass index is 30.89 kg/m.  Estimated Creatinine Clearance: 86.3 mL/min (by C-G formula based on SCr of 1.06 mg/dL).   Based on [facility] policy patient is candidate for enoxaparin 0.5mg /kg TBW SQ every 24 hours based on BMI being >30.  DESCRIPTION: Pharmacy has adjusted enoxaparin dose per [facility] policy.  Patient is now receiving enoxaparin 55 mg every 24 hours    Annabella LOISE Banks, PharmD Clinical Pharmacist  12/17/2023 3:46 PM

## 2023-12-17 NOTE — Assessment & Plan Note (Signed)
 Home rosuvastatin 5 mg daily resumed

## 2023-12-17 NOTE — Anesthesia Preprocedure Evaluation (Addendum)
 Anesthesia Evaluation  Patient identified by MRN, date of birth, ID band Patient awake    Reviewed: Allergy & Precautions, H&P , NPO status , Patient's Chart, lab work & pertinent test results  Airway Mallampati: II  TM Distance: >3 FB Neck ROM: full    Dental no notable dental hx.    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 Denies cognitive changes.   Past Medical History: 08/02/2014: Basal cell carcinoma     Comment:  Right forehead. Superficial.  04/30/2013: Dysplastic nevus     Comment:  Left posterior shoulder. Moderate atypia, lateral margin              involved.  No date: History of kidney stones No date: Kidney stones  Past Surgical History: 07/31/2018: CARDIOVERSION; N/A     Comment:  Procedure: CARDIOVERSION;  Surgeon: Hester Wolm PARAS,               MD;  Location: [facility] ORS;  Service: Cardiovascular;                Laterality: N/A; No date: KNEE SURGERY  BMI    Body Mass Index: 30.89 kg/m      Reproductive/Obstetrics negative OB ROS                              Anesthesia Physical Anesthesia Plan  ASA: 2  Anesthesia Plan: General ETT and General   Post-op Pain Management: Regional block*   Induction:   PONV Risk Score and Plan: 2 and Ondansetron , Dexamethasone and Midazolam  Airway Management Planned:   Additional Equipment:   Intra-op Plan:   Post-operative Plan: Extubation in OR  Informed  Consent: I have reviewed the patients History and Physical, chart, labs and discussed the procedure including the risks, benefits and alternatives for the proposed anesthesia with the patient or authorized representative who has indicated his/her understanding and acceptance.     Dental Advisory Given  Plan Discussed with: CRNA and Surgeon  Anesthesia Plan Comments:          Anesthesia Quick Evaluation

## 2023-12-18 ENCOUNTER — Encounter: Payer: Self-pay | Admitting: Surgery

## 2023-12-18 ENCOUNTER — Telehealth (HOSPITAL_COMMUNITY): Payer: Self-pay

## 2023-12-18 ENCOUNTER — Other Ambulatory Visit (HOSPITAL_COMMUNITY): Payer: Self-pay

## 2023-12-18 DIAGNOSIS — E66811 Obesity, class 1: Secondary | ICD-10-CM | POA: Diagnosis not present

## 2023-12-18 DIAGNOSIS — S76111A Strain of right quadriceps muscle, fascia and tendon, initial encounter: Secondary | ICD-10-CM | POA: Diagnosis not present

## 2023-12-18 DIAGNOSIS — E7849 Other hyperlipidemia: Secondary | ICD-10-CM

## 2023-12-18 LAB — BASIC METABOLIC PANEL WITH GFR
Anion gap: 8 (ref 5–15)
BUN: 21 mg/dL (ref 8–23)
CO2: 25 mmol/L (ref 22–32)
Calcium: 8.2 mg/dL — ABNORMAL LOW (ref 8.9–10.3)
Chloride: 106 mmol/L (ref 98–111)
Creatinine, Ser: 1.14 mg/dL (ref 0.61–1.24)
GFR, Estimated: >60 mL/min (ref >60)
Glucose, Bld: 145 mg/dL — ABNORMAL HIGH (ref 70–99)
Potassium: 4.2 mmol/L (ref 3.5–5.1)
Sodium: 139 mmol/L (ref 135–145)

## 2023-12-18 LAB — CBC
HCT: 35.3 % — ABNORMAL LOW (ref 39.0–52.0)
Hemoglobin: 11 g/dL — ABNORMAL LOW (ref 13.0–17.0)
MCH: 28.6 pg (ref 26.0–34.0)
MCHC: 31.2 g/dL (ref 30.0–36.0)
MCV: 91.9 fL (ref 80.0–100.0)
Platelets: 702 K/uL — ABNORMAL HIGH (ref 150–400)
RBC: 3.84 MIL/uL — ABNORMAL LOW (ref 4.22–5.81)
RDW: 16.8 % — ABNORMAL HIGH (ref 11.5–15.5)
WBC: 9.7 K/uL (ref 4.0–10.5)
nRBC: 0.2 % (ref 0.0–0.2)

## 2023-12-18 LAB — GLUCOSE, CAPILLARY: Glucose-Capillary: 91 mg/dL (ref 70–99)

## 2023-12-18 MED ORDER — APIXABAN 2.5 MG PO TABS
2.5000 mg | ORAL_TABLET | Freq: Two times a day (BID) | ORAL | Status: DC
  Administered 2023-12-18: 2.5 mg via ORAL
  Filled 2023-12-18: qty 1

## 2023-12-18 MED ORDER — APIXABAN 2.5 MG PO TABS: 2.5000 mg | ORAL_TABLET | Freq: Two times a day (BID) | ORAL | 0 refills | Status: AC

## 2023-12-18 MED ORDER — HYDROMORPHONE HCL 2 MG PO TABS: 1.0000 mg | ORAL_TABLET | ORAL | 0 refills | Status: AC | PRN

## 2023-12-18 NOTE — TOC CM/SW Note (Signed)
 Rolling Walker: The beneficiary has a mobility limitation that significantly impairs his/her ability to participate in one or more mobility-related activities of daily living (MRADL) in the home. The patient is able to safely use the walker. The functional mobility deficit can be sufficiently resolved by use of walker.

## 2023-12-18 NOTE — TOC CM/SW Note (Signed)
 Patient is not able to walk the distance required to go the bathroom, or he/she is unable to safely negotiate stairs required to access the bathroom.  A 3in1 BSC will alleviate this problem

## 2023-12-18 NOTE — Evaluation (Signed)
 Occupational Therapy Evaluation Patient Details Name: Anthony Colon MRN: [id number] DOB: [birth year] Today's Date: 12/18/2023   History of Present Illness   Pt is a 73 y.o. year old male with medical history of HLD c/b CAD, and past medical hx of nephrolithiasis and BCC, s/p R quad tendon repair 12/17/23 due a mechanical fall.     Clinical Impressions Upon entering the room, pt seated in recliner chair and agreeable to OT evaluation. Wife present during session. Pt endorses being Ind at baseline and very active. OT reviewed precautions and how to utilize polar care system. They verbalized understanding. Pt dons shorts with min A to thread onto R foot and stands with supervision. Pt needing CGA for balance when pulling shorts over B hips. Shirt donned with supervision while seated. Pt stands once more with supervision and cuing for hand technique with RW and ambulates 50' in room with RW with CGA progressing to close supervision before returning to room. Pt does not need skilled acute OT intervention at this time. Discussing of home environment and needs for equipment for safety. OT to sign off at this time.      If plan is discharge home, recommend the following:   A little help with walking and/or transfers;A little help with bathing/dressing/bathroom;Assist for transportation;Assistance with cooking/housework      Equipment Recommendations   BSC/3in1;Other (comment) (2WW)      Precautions/Restrictions   Precautions Precautions: Fall;Knee Precaution/Restrictions Comments: knee brace, locked into ext Required Braces or Orthoses: Other Brace (hinged knee brace) Knee Immobilizer - Right: On at all times Restrictions Weight Bearing Restrictions Per Provider Order: Yes RLE Weight Bearing Per Provider Order: Partial weight bearing RLE Partial Weight Bearing Percentage or Pounds: 50%     Mobility Bed Mobility               General bed mobility comments: seated in  recliner chair at beginning and end of session    Transfers Overall transfer level: Needs assistance Equipment used: Rolling walker (2 wheels) Transfers: Sit to/from Stand Sit to Stand: Supervision                  Balance Overall balance assessment: Needs assistance Sitting-balance support: No upper extremity supported Sitting balance-Leahy Scale: Normal     Standing balance support: During functional activity, Bilateral upper extremity supported, Reliant on assistive device for balance Standing balance-Leahy Scale: Fair                             ADL either performed or assessed with clinical judgement   ADL Overall ADL's : Needs assistance/impaired                                       General ADL Comments: min A to thread onto R LE but pt stands and pulls over B hips with CGA.     Vision Baseline Vision/History: 1 Wears glasses Ability to See in Adequate Light: 0 Adequate Patient Visual Report: No change from baseline              Pertinent Vitals/Pain Pain Assessment Pain Assessment: No/denies pain     Extremity/Trunk Assessment Upper Extremity Assessment Upper Extremity Assessment: Overall WFL for tasks assessed   Lower Extremity Assessment Lower Extremity Assessment: Defer to PT evaluation RLE Deficits / Details: decreased range and strength due to pain RLE  Sensation: WNL RLE Coordination: WNL   Cervical / Trunk Assessment Cervical / Trunk Assessment: Normal   Communication Communication Communication: Impaired Factors Affecting Communication: Hearing impaired   Cognition Arousal: Alert Behavior During Therapy: WFL for tasks assessed/performed Cognition: No apparent impairments                               Following commands: Intact                  Home Living Family/patient expects to be discharged to:: Private residence Living Arrangements: Spouse/significant other Available Help at  Discharge: Family;Available 24 hours/day Type of Home: House Home Access: Level entry     Home Layout: One level;Laundry or work area in basement;Full bath on main level     Bathroom Shower/Tub: Producer, Television/film/video: Standard Bathroom Accessibility: Yes   Home Equipment: None;Shower seat          Prior Functioning/Environment Prior Level of Function : Independent/Modified Independent             Mobility Comments: tourist information centre manager, works full time, 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 ADLs Comments: Independent with ADLs and IADLs    OT Problem List:     OT Treatment/Interventions:        OT Goals(Current goals can be found in the care plan section)   Acute Rehab OT Goals Patient Stated Goal: to go home OT Goal Formulation: With patient/family Time For Goal Achievement: 12/18/23 Potential to Achieve Goals: Good   AM-PAC OT 6 Clicks Daily Activity     Outcome Measure Help from another person eating meals?: None Help from another person taking care of personal grooming?: None Help from another person toileting, which includes using toliet, bedpan, or urinal?: None Help from another person bathing (including washing, rinsing, drying)?: A Little Help from another person to put on and taking off regular upper body clothing?: None Help from another person to put on and taking off regular lower body clothing?: A Little 6 Click Score: 22   End of Session Equipment Utilized During Treatment: Rolling walker (2 wheels)  Activity Tolerance: Patient tolerated treatment well Patient left: in chair;with call bell/phone within reach;with family/visitor present                   Time: 8887-8847 OT Time Calculation (min): 40 min Charges:  OT General Charges $OT Visit: 1 Visit OT Evaluation $OT Eval Low Complexity: 1 Low OT Treatments $Self Care/Home Management : 8-22 mins $Therapeutic  Activity: 8-22 mins  Izetta Claude, MS, OTR/L , CBIS ascom [phone]  12/18/23, 12:11 PM

## 2023-12-18 NOTE — Evaluation (Addendum)
 Physical Therapy Evaluation Patient Details Name: Anthony Colon MRN: [id number] DOB: 16-Feb-1950 Today's Date: 12/18/2023  History of Present Illness  Pt is a 73 y.o. year old male with medical history of HLD c/b CAD, and past medical hx of nephrolithiasis and BCC, s/p R quad tendon repair 12/17/23 due a mechanical fall.  Clinical Impression  Pt was with visitors on arrival, pleasant and motivated to participate during the session and put forth good effort throughout. Pt did not report any pain, only mild discomfort, SpO2 WNL throughout. Pt was educated on [facility] precautions and exercises to help with ROM/strength. Pt performed quad sets, ankle pumps, assisted hip abd/add, and assisted SLR. Pt was able to perform supine to sit EOB independently. Pt required elevated bed for standing bedside with RW. Pt needed additional cueing for hand and R LE placement upon standing with RW. Pt good standing balance, was able to march in place (single leg and alternating), and accept weight onto R LE without any overt LOB or R knee buckling. Pt ambulated from the bed to nurses station (47ft) with a step to pattern, CGA and no LOB outside BOS. Pt was left in chair with family present with feet elevated and ice applied to R knee. Pt will benefit from continued PT services upon discharge to safely address deficits listed in patient problem list for decreased caregiver assistance, improve strength and eventual return to PLOF.       If plan is discharge home, recommend the following: A little help with walking and/or transfers;A little help with bathing/dressing/bathroom;Assistance with cooking/housework;Assist for transportation;Help with stairs or ramp for entrance   Can travel by private vehicle        Equipment Recommendations Rolling walker (2 wheels);BSC/3in1  Recommendations for Other Services       Functional Status Assessment Patient has had a recent decline in their functional status and demonstrates  the ability to make significant improvements in function in a reasonable and predictable amount of time.     Precautions / Restrictions Precautions Precautions: Fall;Knee Precaution Booklet Issued: No Recall of Precautions/Restrictions: Intact Precaution/Restrictions Comments: knee brace, locked into ext Required Braces or Orthoses: Knee Immobilizer - Right Knee Immobilizer - Right: On at all times Restrictions Weight Bearing Restrictions Per Provider Order: Yes RLE Weight Bearing Per Provider Order: Partial weight bearing      Mobility  Bed Mobility Overal bed mobility: Independent                  Transfers Overall transfer level: Needs assistance Equipment used: Rolling walker (2 wheels)               General transfer comment: able to transfer from sit to stand with CGA    Ambulation/Gait Ambulation/Gait assistance: Contact guard assist Gait Distance (Feet): 75 Feet Assistive device: Rolling walker (2 wheels) Gait Pattern/deviations: Step-to pattern, Decreased stance time - right, Decreased step length - left Gait velocity: decreased   Pre-gait activities: single leg and alternating marching at bedside General Gait Details: pt performed single leg marches and alternating marching pattern with CGA, no LOB no buckling. Pt ambulated approx 75 ft with CGA, no LOB, no buckling.  Stairs            Wheelchair Mobility     Tilt Bed    Modified Rankin (Stroke Patients Only)       Balance Overall balance assessment: Needs assistance (tolerated without buckling or LOB outside BOS) Sitting-balance support: No upper extremity supported Sitting balance-Leahy Scale:  Normal     Standing balance support: No upper extremity supported (was able to push through UE into walker for support)  Standing balance-Leahy Scale: Fair Standing balance comment: tolerated weight through R LE with RW -- was able to push through UEs                              Pertinent Vitals/Pain Pain Assessment Pain Assessment: 0-10 Pain Score: 0-No pain Pain Descriptors / Indicators: Sore Pain Intervention(s): Monitored during session, Repositioned, Ice applied    Home Living Family/patient expects to be discharged to:: Private residence Living Arrangements: Spouse/significant other Available Help at Discharge: Family;Available 24 hours/day Type of Home: House Home Access: Level entry       Home Layout: One level;Laundry or work area in basement;Full bath on main level Home Equipment: None;Shower seat      Prior Function Prior Level of Function : Independent/Modified Independent             Mobility Comments: tourist information centre manager, works full time, 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 ADLs Comments: independent     Extremity/Trunk Assessment        Lower Extremity Assessment Lower Extremity Assessment: RLE deficits/detail RLE Deficits / Details: decreased range due to knee being locked into ext/immobilized -unable to active assist SLR  RLE Sensation: WNL RLE Coordination: WNL    Cervical / Trunk Assessment Cervical / Trunk Assessment: Normal  Communication   Communication Communication: No apparent difficulties    Cognition Arousal: Alert Behavior During Therapy: WFL for tasks assessed/performed   PT - Cognitive impairments: No apparent impairments                                 Cueing Cueing Techniques: Verbal cues, Gestural cues, Tactile cues, Visual cues     General Comments      Exercises Total Joint Exercises Ankle Circles/Pumps: AROM, Right, 10 reps, Supine Quad Sets: AROM, Right, 10 reps, Supine Straight Leg Raises: 5 reps, Right, AAROM Marching in Standing: AROM, Both, 10 reps, Standing   Assessment/Plan    PT Assessment Patient needs continued PT services  PT Problem List Decreased strength;Decreased range of motion;Decreased  activity tolerance;Decreased balance;Decreased mobility;Decreased knowledge of use of DME;Decreased safety awareness;Decreased knowledge of precautions;Pain;Decreased coordination       PT Treatment Interventions DME instruction;Gait training;Stair training;Functional mobility training;Therapeutic activities;Therapeutic exercise;Balance training;Patient/family education    PT Goals (Current goals can be found in the Care Plan section)  Acute Rehab PT Goals Patient Stated Goal: get back to work and riding bike PT Goal Formulation: With patient/family Time For Goal Achievement: 12/31/23 Potential to Achieve Goals: Good    Frequency Min 3X/week     Co-evaluation               AM-PAC PT 6 Clicks Mobility  Outcome Measure Help needed turning from your back to your side while in a flat bed without using bedrails?: None Help needed moving from lying on your back to sitting on the side of a flat bed without using bedrails?: None Help needed moving to and from a bed to a chair (including a wheelchair)?: A Little Help needed standing up from a chair using your arms (e.g., wheelchair or bedside chair)?: A Little Help needed to walk in hospital room?: A Little Help needed climbing 3-5 steps  with a railing? : A Little 6 Click Score: 20    End of Session Equipment Utilized During Treatment: Gait belt Activity Tolerance: Patient tolerated treatment well;No increased pain Patient left: in chair;with call bell/phone within reach;with chair alarm set;with family/visitor present Nurse Communication: Mobility status PT Visit Diagnosis: Other abnormalities of gait and mobility (R26.89);History of falling (Z91.81);Pain;Unsteadiness on feet (R26.81);Muscle weakness (generalized) (M62.81) Pain - Right/Left: Right Pain - part of body: Knee    Time: 9091-9053 PT Time Calculation (min) (ACUTE ONLY): 38 min   Charges:               This entire session was performed under direct  supervision and direction of a licensed therapist/therapist assistant. I have personally read, edited and approve of the note as written.  Carmin Deed, DPT [place], SPT 12/18/23, 11:56 AM

## 2023-12-18 NOTE — Plan of Care (Signed)
   Problem: Education: Goal: Knowledge of General Education information will improve Description Including pain rating scale, medication(s)/side effects and non-pharmacologic comfort measures Outcome: Progressing

## 2023-12-18 NOTE — Discharge Summary (Signed)
 Physician Discharge Summary   Patient: Anthony Colon MRN: [id number] DOB: Mar 13, 1950  Admit date:     12/16/2023  Discharge date: 12/18/23  Discharge Physician: Murvin Mana   PCP: Alla Amis, MD   Recommendations at discharge:   Follow-up with PCP in 1 week. Follow-up results of PT as scheduled.  Discharge Diagnoses: Principal Problem:   Quadriceps tendon rupture, right, initial encounter Active Problems:   HLD (hyperlipidemia)   CAD (coronary artery disease)   Class 1 obesity  Resolved Problems:   * No resolved hospital problems. [facility] Course: Mr. Samanyu Tinnell is a 73 year old male with history of CAD, hyperlipidemia, presents to the ED for chief concerns of right knee pain after a fall. Patient sustained a right quadriceps tendon rupture. Patient was seen by orthopedics, had a tendon repair performed on 11/11. Postoperatively, patient has been doing well with the physical therapy.  Medically stable for discharge.  Assessment and Plan: * Quadriceps tendon rupture, right, initial encounter Status post quadriceps tendon repair.  Doing well.  Also previously cleared patient for discharge.  Also seen by PT/OT, recommended home PT. Also previously prescribed pain medicine as well as prophylactic Eliquis.  Patient is medically stable for discharge.  HLD (hyperlipidemia) Home rosuvastatin 5 mg daily resumed  Class I obesity with BMI 31.28. Diet exercise.      Consultants: Orthopedics Procedures performed: Tendon repair Disposition: Home health Diet recommendation:  Discharge Diet Orders (From admission, onward)     Start     Ordered   12/18/23 0000  Diet - low sodium heart healthy        12/18/23 1158           Cardiac diet DISCHARGE MEDICATION: Allergies as of 12/18/2023       Reactions   Ciprofloxacin Swelling   ANGIOEDEMA   Oxycodone  Other (See Comments)   It made the room spin.        Medication List     TAKE these  medications    apixaban 2.5 MG Tabs tablet Commonly known as: ELIQUIS Take 1 tablet (2.5 mg total) by mouth 2 (two) times daily.   cyclobenzaprine  5 MG tablet Commonly known as: FLEXERIL  Take 1-2 tablets (5-10 mg total) by mouth 3 (three) times daily as needed.   HYDROmorphone  2 MG tablet Commonly known as: DILAUDID  Take 0.5-1 tablets (1-2 mg total) by mouth every 4 (four) hours as needed for moderate pain (pain score 4-6).   mupirocin  ointment 2 % Commonly known as: BACTROBAN  Apply to skin qd-bid   omeprazole 20 MG capsule Commonly known as: PRILOSEC Take 20 mg by mouth daily.   rosuvastatin 5 MG tablet Commonly known as: CRESTOR Take by mouth.               Discharge Care Instructions  (From admission, onward)           Start     Ordered   12/18/23 0000  Discharge wound care:       Comments: Follow up with orthopedics   12/18/23 1158            Follow-up Information     Kip Lynwood Double, PA-C Follow up in 14 day(s).   Specialty: Physician Assistant Why: Staple removal from the right knee. Contact information: [address] [place] [address] [phone]         Alla Amis, MD Follow up in 1 week(s).   Specialty: Family Medicine Contact information: [address] [facility] [place] [address] [phone]                Discharge Exam: Fredricka Weights   12/17/23 0408 12/17/23 1324 12/18/23 0500  Weight: 112.1 kg 112.1 kg 113.5 kg   General exam: Appears calm and comfortable  Respiratory system: Clear to auscultation. Respiratory effort normal. Cardiovascular system: S1 & S2 heard, RRR. No JVD, murmurs, rubs, gallops or clicks. No pedal edema. Gastrointestinal system: Abdomen is nondistended, soft and nontender. No organomegaly or masses felt. Normal bowel sounds heard. Central nervous system: Alert and oriented. No focal neurological deficits. Extremities: Symmetric 5 x 5 power. Skin:  No rashes, lesions or ulcers Psychiatry: Judgement and insight appear normal. Mood & affect appropriate.    Condition at discharge: good  The results of significant diagnostics from this hospitalization (including imaging, microbiology, ancillary and laboratory) are listed below for reference.   Imaging Studies: DG Wrist Complete Right Result Date: 12/17/2023 CLINICAL DATA:  Acute pain of right wrist.  Fall yesterday. EXAM: DG WRIST COMPLETE 3+V*R* COMPARISON:  None Available. FINDINGS: The mineralization and alignment are normal. On the lateral view, there is a probable small avulsion fracture posteriorly, likely arising from the triquetrum. No other evidence of acute fracture or dislocation. There is posterior soft tissue swelling. Mild degenerative changes at the 1st carpometacarpal joint with mild intraosseous cyst formation in the lunate. IV tubing in place. IMPRESSION: Probable small avulsion fracture posteriorly, likely arising from the triquetrum. Associated posterior soft tissue swelling. Electronically Signed   By: Elsie Perone M.D.   On: 12/17/2023 15:11   US  OR NERVE BLOCK-IMAGE ONLY [facility]) Result Date: 12/17/2023 There is no interpretation for this exam.  This order is for images obtained during a surgical procedure.  Please See Surgeries Tab for more information regarding the procedure.   US  THYROID Result Date: 12/17/2023 CLINICAL DATA:  Incidental on CT. EXAM: THYROID ULTRASOUND TECHNIQUE: Ultrasound examination of the thyroid gland and adjacent soft tissues was performed. COMPARISON:  12/16/2023 FINDINGS: Parenchymal Echotexture: Mildly heterogeneous Isthmus: 0.8 cm Right lobe: 5.2 x 3.0 x 3.4 cm Left lobe: 1.3 x 0.7 x 0.7 cm ________________________________________________________ Estimated total number of nodules >/= 1 cm: 2 Number of spongiform nodules >/=  2 cm not described below (TR1): 0 Number of mixed cystic and solid nodules >/= 1.5 cm not described below (TR2): 0  _________________________________________________________ Nodule # 1: Location: Isthmus; Superior Maximum size: 2.1 cm; Other 2 dimensions: 1.1 x 2.1 cm Composition: mixed cystic and solid (1) Echogenicity: isoechoic (1) Shape: not taller-than-wide (0) Margins: smooth (0) Echogenic foci: none (0) ACR TI-RADS total points: 2. ACR TI-RADS risk category: TR2 (2 points). ACR TI-RADS recommendations: This nodule does NOT meet TI-RADS criteria for biopsy or dedicated follow-up. _________________________________________________________ Nodule # 2: Location: Right; Mid Maximum size: 3.6 cm; Other 2 dimensions: 3.1 x 3.5 cm Composition: mixed cystic and solid (1) Echogenicity: isoechoic (1) Shape: not taller-than-wide (0) Margins: smooth (0) Echogenic foci: none (0) ACR TI-RADS total points: 2. ACR TI-RADS risk category: TR2 (2 points). ACR TI-RADS recommendations: This nodule does NOT meet TI-RADS criteria for biopsy or dedicated follow-up. _________________________________________________________ No cervical lymphadenopathy. IMPRESSION: Benign-appearing multinodular thyroid. The visualized isthmus and right thyroid nodules do not meet criteria for dedicated follow-up. The above is in keeping with the ACR TI-RADS recommendations - J Am Coll Radiol 2017;14:587-595. Ester Sides, MD [facility] Radiology Electronically Signed   By: Ester Sides M.D.   On: 12/17/2023 11:22  MR KNEE RIGHT WO CONTRAST Result Date: 12/16/2023 CLINICAL DATA:  Surgical planning, quadriceps tendon rupture EXAM: MRI OF THE RIGHT KNEE WITHOUT CONTRAST TECHNIQUE: Multiplanar, multisequence MR imaging of the knee was performed. No intravenous contrast was administered. COMPARISON:  Radiographs 12/16/2023 FINDINGS: MENISCI Medial meniscus: Large radial tear of the posterior horn adjacent to the midbody with free edge irregularity in the remainder of the posterior horn. Lateral meniscus:  Unremarkable  LIGAMENTS Cruciates:  Unremarkable Collaterals:  Unremarkable CARTILAGE Patellofemoral: Severe full-thickness chondral thinning laterally in the patellofemoral compartment. Medial: Prominent chondral thinning along much of the medial compartment. Lateral: Mild chondral thinning anteriorly in the lateral compartment. Joint: S knee effusion extends into the suprapatellar bursa and communicates freely through the quadriceps tendon defect with the prepatellar soft tissues and prepatellar bursa region. Popliteal Fossa: Small amount of infiltrative edema in the popliteal space. Extensor Mechanism: Ruptured distal quadriceps tendon with about 2.9 cm retraction. Extensive edema in the vastus medialis and vastus lateralis muscles which have small intact tendon components contributing to the patellar retinacula. Lax patellar tendon. Bones:  No fracture or acute bony findings. Other: Substantial hemorrhagic prepatellar bursitis with surrounding prepatellar edema. IMPRESSION: 1. Ruptured distal quadriceps tendon with about 2.9 cm retraction. Extensive edema in the vastus medialis and vastus lateralis muscles which have small intact tendon components contributing to the patellar retinacula. 2. Large radial tear of the posterior horn medial meniscus adjacent to the midbody with free edge irregularity in the remainder of the posterior horn. 3. Severe full-thickness chondral thinning laterally in the patellofemoral compartment. Prominent chondral thinning along much of the medial compartment. Mild chondral thinning anteriorly in the lateral compartment. 4. Substantial hemorrhagic prepatellar bursitis with surrounding prepatellar edema. Electronically Signed   By: Ryan Salvage M.D.   On: 12/16/2023 19:39   DG Chest Portable 1 View Result Date: 12/16/2023 CLINICAL DATA:  Preoperative EXAM: PORTABLE CHEST 1 VIEW COMPARISON:  Chest x-ray 08/06/2011 FINDINGS: The heart size and mediastinal contours are within normal limits.  Both lungs are clear. The visualized skeletal structures are unremarkable. IMPRESSION: No active disease. Electronically Signed   By: Greig Pique M.D.   On: 12/16/2023 17:26   CT Head Wo Contrast Result Date: 12/16/2023 EXAM: CT HEAD WITHOUT CONTRAST 12/16/2023 03:21:00 PM TECHNIQUE: CT of the head was performed without the administration of intravenous contrast. Automated exposure control, iterative reconstruction, and/or weight based adjustment of the mA/kV was utilized to reduce the radiation dose to as low as reasonably achievable. COMPARISON: CT head 08/15/2021. CLINICAL HISTORY: Head trauma, minor (Age >= 65y), fall down stairs with forehead laceration, dizziness, blurred vision, and neck pain. FINDINGS: BRAIN AND VENTRICLES: There is no evidence of an acute infarct, intracranial hemorrhage, mass, midline shift, hydrocephalus, or extra-axial fluid collection. Cerebral volume is within normal limits for age. Minimal cerebral white matter hypodensities are nonspecific but compatible with chronic small vessel ischemic disease. ORBITS: No acute abnormality. SINUSES: No acute abnormality. SOFT TISSUES AND SKULL: No acute soft tissue abnormality. No skull fracture. IMPRESSION: 1. No acute intracranial abnormality. Electronically signed by: Dasie Hamburg MD 12/16/2023 04:20 PM EST RP Workstation: [id number]   CT Cervical Spine Wo Contrast Result Date: 12/16/2023 CLINICAL DATA:  Neck trauma (Age >= 65y) Status post fall. Head injury with laceration, dizziness, blurred vision and neck pain. EXAM: CT CERVICAL SPINE WITHOUT CONTRAST TECHNIQUE: Multidetector CT imaging of the cervical spine was performed without intravenous contrast. Multiplanar CT image reconstructions were also generated. RADIATION DOSE REDUCTION: This exam was performed according to  the departmental dose-optimization program which includes automated exposure control, adjustment of the mA and/or kV according to patient size and/or use of  iterative reconstruction technique. COMPARISON:  CT cervical spine 08/06/2011. FINDINGS: Alignment: Straightening without focal angulation or listhesis. Skull base and vertebrae: No evidence of acute cervical spine fracture or traumatic subluxation. Multilevel spondylosis has mildly progressed from the remote CT. Soft tissues and spinal canal: No prevertebral fluid or swelling. No visible canal hematoma. Disc levels: Multilevel spondylosis with disc space narrowing, uncinate spurring and facet hypertrophy, most advanced at C5-6 and C6-7. No evidence of large disc herniation. Mild-to-moderate osseous foraminal narrowing at several levels. Upper chest: Incompletely visualized right thyroid nodule, measuring approximately 3.3 x 2.7 cm on image 97/4. Clear lung apices. Other: Bilateral carotid atherosclerosis. IMPRESSION: 1. No evidence of acute cervical spine fracture, traumatic subluxation or static signs of instability. 2. Multilevel cervical spondylosis as described. 3. Incompletely visualized right thyroid nodule, measuring up to 3.3 cm. Recommend thyroid US .(Ref: J Am Coll Radiol. 2015 Feb;12(2): 143-50). Electronically Signed   By: Elsie Perone M.D.   On: 12/16/2023 15:29   DG Knee Complete 4 Views Right Result Date: 12/16/2023 CLINICAL DATA:  Fall down stairs injuring leg EXAM: RIGHT KNEE - COMPLETE 4+ VIEW COMPARISON:  None Available. FINDINGS: Suspected rupture of the distal quadriceps tendon with retracted calcifications back 2.7 cm from the patella, extensive surrounding indistinctness of soft tissue planes, prepatellar edema/bursitis, and fluid in the suprapatellar bursa. Mildly lax patellar tendon. MRI could be utilized to further characterize, if clinically warranted. Moderate osteoarthritis with medial compartmental narrowing and marginal spurring in the medial compartment and patellofemoral joint. IMPRESSION: 1. Suspected rupture of the distal quadriceps tendon with retracted calcifications back  2.7 cm from the patella, extensive surrounding indistinctness of soft tissue planes, and fluid in the suprapatellar bursa. This could be further characterized with MRI if clinically warranted. 2. Moderate osteoarthritis. Electronically Signed   By: Ryan Salvage M.D.   On: 12/16/2023 15:21    Microbiology: Results for orders placed or performed during the hospital encounter of 07/28/18  Novel Coronavirus, NAA (hospital order; send-out to ref lab)     Status: None   Collection Time: 07/28/18 11:20 AM   Specimen: Nasopharyngeal Swab; Respiratory  Result Value Ref Range Status   SARS-CoV-2, NAA NOT DETECTED NOT DETECTED Final    Comment: (NOTE) This test was developed and its performance characteristics determined by [employer]. This test has not been FDA cleared or approved. This test has been authorized by FDA under an Emergency Use Authorization (EUA). This test is only authorized for the duration of time the declaration that circumstances exist justifying the authorization of the emergency use of in vitro diagnostic tests for detection of SARS-CoV-2 virus and/or diagnosis of COVID-19 infection under section 564(b)(1) of the Act, 21 U.S.C. 639aaa-6(a)(8), unless the authorization is terminated or revoked sooner. When diagnostic testing is negative, the possibility of a false negative result should be considered in the context of a patient's recent exposures and the presence of clinical signs and symptoms consistent with COVID-19. An individual without symptoms of COVID-19 and who is not shedding SARS-CoV-2 virus would expect to have a negative (not detected) result in this assay. Performed  At: [facility] [address] [place], [place] [id number] Jennette Shorter MD Ey:[phone]    Coronavirus Source NASOPHARYNGEAL  Final    Comment: Performed at [facility], [address] [place], [place] [address]    Labs: CBC: Recent Labs  Lab  12/16/23 1644 12/17/23 0508 12/18/23 0529  WBC 13.1* 8.2 9.7  HGB 13.0 11.9* 11.0*  HCT 40.2 37.1* 35.3*  MCV 89.3 89.8 91.9  PLT 669* 662* 702*   Basic Metabolic Panel: Recent Labs  Lab 12/16/23 1644 12/16/23 2021 12/17/23 0508 12/18/23 0529  NA 140  --  139 139  K 4.2  --  4.0 4.2  CL 108  --  107 106  CO2 22  --  23 25  GLUCOSE 96  --  110* 145*  BUN 30*  --  24* 21  CREATININE 1.19  --  1.06 1.14  CALCIUM 8.7*  --  8.4* 8.2*  MG  --  2.2  --   --    Liver Function Tests: Recent Labs  Lab 12/16/23 1644  AST 25  ALT 22  ALKPHOS 41  BILITOT 1.0  PROT 6.6  ALBUMIN 4.1   CBG: Recent Labs  Lab 12/17/23 0813 12/18/23 0812  GLUCAP 110* 91    Discharge time spent: 32 minutes.  Signed: Murvin Mana, MD [facility] 12/18/2023

## 2023-12-18 NOTE — TOC Transition Note (Addendum)
 Transition of Care [facility]) - Discharge Note   Patient Details  Name: Anthony Colon MRN: [id number] Date of Birth: 17-Feb-1950  [facility] CM/SW Contact:  Victory Jackquline RAMAN, RN Phone Number: 12/18/2023, 1:00 PM   Clinical Narrative:   RNCM received a message via secure chat from the bedside nurse stating that the patient was discharging the patient today and wanted to know if the DME could be delivered to the patient discharge lounge.   RNCM met with patient and his wife at bedside. RNCM introduced role and explained that discharge planning would be discussed. RNCM Introduced role and explained that discharge planning would be discussed. PT is recommending HH/PT. Patient is in agreement. I went over the list of [facility] Agencies with him and left the list with him and his wife to look at and they asked me to submit referrals to [facility], [facility] and [facility]. . I sent referrals via EPIC and will review agencies that accepted them with them once received. Patient has DME at home. [facility]) and wanted to know if the DME could be delivered to their house. I spoke to Nate from PT and asked him to speak tot he patient and his wife and assess the situation. Nate came with me to the patient's room and spoke to the patient and his wife and he said it was okay for them to discharge home using the walker they have at home.  I spoke to [place] with [employer] and ordered the RW and BSC/3n1 and asked him to delivered to the patient's home. States he will fill order and deliver it this aftrernoon.  No further concerns. RNCM signing off.  3:50 pm: Spoke to the patient's wife [name] and informed her that [facility], her first choice accepted them. Advised her that they would be giving her a call to set up an appointment to come out. She verbalized understanding.  Final next level of care: Home w Home Health Services Barriers to Discharge: Barriers Resolved   Patient Goals and CMS Choice             Discharge Placement                Patient to be transferred to facility by: Spouse Name of family member notified: [name] Patient and family notified of of transfer: 12/18/23  Discharge Plan and Services Additional resources added to the After Visit Summary for                  DME Arranged: 3-N-1, Walker rolling DME Agency: [facility] Date DME Agency Contacted: 12/18/23   Representative spoke with at DME Agency: [facility] HH Arranged: PT HH Agency:  (Referrals Submitted via EPIC Portal) Date HH Agency Contacted: 12/18/23   Representative spoke with at [facility] Agency: Referrals submitted via EPIC Portal  Social Drivers of Health (SDOH) Interventions SDOH Screenings   Food Insecurity: No Food Insecurity (12/17/2023)  Housing: Low Risk  (12/17/2023)  Transportation Needs: No Transportation Needs (12/17/2023)  Utilities: Not At Risk (12/17/2023)  Financial Resource Strain: Low Risk  (09/17/2023)   Received from [facility]  Tobacco Use: Low Risk  (12/17/2023)  Recent Concern: Tobacco Use - Medium Risk (12/16/2023)   Received from [facility]     Readmission Risk Interventions     No data to display

## 2023-12-18 NOTE — Discharge Instructions (Signed)
 Diet: As you were doing prior to hospitalization   Shower:  Can sponge bath, keep knee is extension for any bathing performed.  Dressing:  Leave dressing on until first post op visit.  Activity:  Increase activity slowly as tolerated, but follow the weight bearing instructions below.  No lifting or driving for 6 weeks.  Weight Bearing:   Weight bearing as tolerated to right leg with the knee locked in extension.  To prevent constipation: you may use a stool softener such as -  Colace (over the counter) 100 mg by mouth twice a day  Drink plenty of fluids (prune juice may be helpful) and high fiber foods Miralax (over the counter) for constipation as needed.    Itching:  If you experience itching with your medications, try taking only a single pain pill, or even half a pain pill at a time.  You may take up to 10 pain pills per day, and you can also use benadryl over the counter for itching or also to help with sleep.   Precautions:  If you experience chest pain or shortness of breath - call 911 immediately for transfer to the hospital emergency department!!  If you develop a fever greater that 101 F, purulent drainage from wound, increased redness or drainage from wound, or calf pain-Call [facility]                                              Follow- Up Appointment:  Please call for an appointment to be seen in 2 weeks at [facility]

## 2023-12-18 NOTE — Telephone Encounter (Signed)
 Pharmacy Patient Advocate Encounter  Insurance verification completed.    The patient is insured through [employer]. Patient has Medicare and is not eligible for a copay card, but may be able to apply for patient assistance or Medicare RX Payment Plan (Patient Must reach out to their plan, if eligible for payment plan), if available.    Ran test claim for Eliquis 2.5mg  and the current 30 day co-pay is $76.32.   This test claim was processed through [facility]- copay amounts may vary at other pharmacies due to pharmacy/plan contracts, or as the patient moves through the different stages of their insurance plan.

## 2023-12-18 NOTE — Progress Notes (Signed)
  Subjective: 1 Day Post-Op Procedure(s) (LRB): REPAIR, TENDON, QUADRICEPS (Right) Patient reports pain as mild.   Patient is well, and has had no acute complaints or problems PT and care management to assist with discharge planning. Currently building a house, Wife and patient currently living with son, no stairs to navigate. Negative for chest pain and shortness of breath Fever: no Gastrointestinal:Negative for nausea and vomiting  Objective: Vital signs in last 24 hours: Temp:  [98.1 F (36.7 C)-98.7 F (37.1 C)] 98.3 F (36.8 C) (11/12 0120) Pulse Rate:  [68-79] 68 (11/12 0120) Resp:  [11-19] 18 (11/12 0120) BP: (112-125)/(54-71) 115/66 (11/12 0120) SpO2:  [92 %-97 %] 93 % (11/12 0120) Weight:  [112.1 kg-113.5 kg] 113.5 kg (11/12 0500)  Intake/Output from previous day:  Intake/Output Summary (Last 24 hours) at 12/18/2023 0745 Last data filed at 12/17/2023 1800 Gross per 24 hour  Intake 800 ml  Output 100 ml  Net 700 ml    Intake/Output this shift: No intake/output data recorded.  Labs: Recent Labs    12/16/23 1644 12/17/23 0508 12/18/23 0529  HGB 13.0 11.9* 11.0*   Recent Labs    12/17/23 0508 12/18/23 0529  WBC 8.2 9.7  RBC 4.13* 3.84*  HCT 37.1* 35.3*  PLT 662* 702*   Recent Labs    12/17/23 0508 12/18/23 0529  NA 139 139  K 4.0 4.2  CL 107 106  CO2 23 25  BUN 24* 21  CREATININE 1.06 1.14  GLUCOSE 110* 145*  CALCIUM 8.4* 8.2*   Recent Labs    12/16/23 2021  INR 1.2     EXAM General - Patient is Alert, Appropriate, and Oriented Extremity - ABD soft Neurovascular intact Dorsiflexion/Plantar flexion intact Dressing/Incision - Knee ROM brace applied to the right leg, locked in extension. Incision clean without drainage noted to the right knee honeycomb dressing. Motor Function - intact, moving foot and toes well on exam.  Abdomen soft with intact bowel sounds.  Past Medical History:  Diagnosis Date   Basal cell carcinoma 08/02/2014    Right forehead. Superficial.    Dysplastic nevus 04/30/2013   Left posterior shoulder. Moderate atypia, lateral margin involved.    History of kidney stones    Kidney stones     Assessment/Plan: 1 Day Post-Op Procedure(s) (LRB): REPAIR, TENDON, QUADRICEPS (Right) Principal Problem:   Quadriceps tendon rupture, right, initial encounter Active Problems:   HLD (hyperlipidemia)   CAD (coronary artery disease)  Estimated body mass index is 31.28 kg/m as calculated from the following:   Height as of this encounter: 6' 3 (1.905 m).   Weight as of this encounter: 113.5 kg. Advance diet Up with therapy D/C IV fluids when tolerating po intake.  Labs and vitals reviewed.  Hg 11.0, WBC 9.7. Continue to work on a BM. Up with therapy today. Nerve block was performed, increased to WBAT with the knee brace locked in extension. Discharge home with HHPT pending progress with therapy, discussed the possible need for SNF but doubtful this will be needed.  DVT Prophylaxis - Lovenox WBAT to the right leg with knee brace locked in extension.  DOROTHA Gustavo Level, PA-C [facility] 12/18/2023, 7:45 AM

## 2024-01-08 ENCOUNTER — Encounter: Payer: Self-pay | Admitting: Dermatology

## 2024-01-08 ENCOUNTER — Ambulatory Visit: Admitting: Dermatology

## 2024-01-08 DIAGNOSIS — L578 Other skin changes due to chronic exposure to nonionizing radiation: Secondary | ICD-10-CM

## 2024-01-08 DIAGNOSIS — L570 Actinic keratosis: Secondary | ICD-10-CM

## 2024-01-08 DIAGNOSIS — L820 Inflamed seborrheic keratosis: Secondary | ICD-10-CM

## 2024-01-08 DIAGNOSIS — D229 Melanocytic nevi, unspecified: Secondary | ICD-10-CM

## 2024-01-08 DIAGNOSIS — D492 Neoplasm of unspecified behavior of bone, soft tissue, and skin: Secondary | ICD-10-CM

## 2024-01-08 DIAGNOSIS — Z1283 Encounter for screening for malignant neoplasm of skin: Secondary | ICD-10-CM

## 2024-01-08 DIAGNOSIS — Z85828 Personal history of other malignant neoplasm of skin: Secondary | ICD-10-CM

## 2024-01-08 DIAGNOSIS — L729 Follicular cyst of the skin and subcutaneous tissue, unspecified: Secondary | ICD-10-CM

## 2024-01-08 DIAGNOSIS — Z86018 Personal history of other benign neoplasm: Secondary | ICD-10-CM

## 2024-01-08 DIAGNOSIS — L814 Other melanin hyperpigmentation: Secondary | ICD-10-CM

## 2024-01-08 NOTE — Patient Instructions (Addendum)
 Cryotherapy Aftercare  Wash gently with soap and water everyday.   Apply Vaseline Jelly daily until healed.    Wound Care Instructions  Cleanse wound gently with soap and water once a day then pat dry with clean gauze. Apply a thin coat of Petrolatum (petroleum jelly, Vaseline) over the wound (unless you have an allergy to this). We recommend that you use a new, sterile tube of Vaseline. Do not pick or remove scabs. Do not remove the yellow or white healing tissue from the base of the wound.  Cover the wound with fresh, clean, nonstick gauze and secure with paper tape. You may use Band-Aids in place of gauze and tape if the wound is small enough, but would recommend trimming much of the tape off as there is often too much. Sometimes Band-Aids can irritate the skin.  You should call the office for your biopsy report after 1 week if you have not already been contacted.  If you experience any problems, such as abnormal amounts of bleeding, swelling, significant bruising, significant pain, or evidence of infection, please call the office immediately.  FOR ADULT SURGERY PATIENTS: If you need something for pain relief you may take 1 extra strength Tylenol  (acetaminophen ) AND 2 Ibuprofen (200mg  each) together every 4 hours as needed for pain. (do not take these if you are allergic to them or if you have a reason you should not take them.) Typically, you may only need pain medication for 1 to 3 days.      Recommend daily broad spectrum sunscreen SPF 30+ to sun-exposed areas, reapply every 2 hours as needed. Call for new or changing lesions.  Staying in the shade or wearing long sleeves, sun glasses (UVA+UVB protection) and wide brim hats (4-inch brim around the entire circumference of the hat) are also recommended for sun protection.      Melanoma ABCDEs  Melanoma is the most dangerous type of skin cancer, and is the leading cause of death from skin disease.  You are more likely to develop  melanoma if you: Have light-colored skin, light-colored eyes, or red or blond hair Spend a lot of time in the sun Tan regularly, either outdoors or in a tanning bed Have had blistering sunburns, especially during childhood Have a close family member who has had a melanoma Have atypical moles or large birthmarks  Early detection of melanoma is key since treatment is typically straightforward and cure rates are extremely high if we catch it early.   The first sign of melanoma is often a change in a mole or a new dark spot.  The ABCDE system is a way of remembering the signs of melanoma.  A for asymmetry:  The two halves do not match. B for border:  The edges of the growth are irregular. C for color:  A mixture of colors are present instead of an even brown color. D for diameter:  Melanomas are usually (but not always) greater than 6mm - the size of a pencil eraser. E for evolution:  The spot keeps changing in size, shape, and color.  Please check your skin once per month between visits. You can use a small mirror in front and a large mirror behind you to keep an eye on the back side or your body.   If you see any new or changing lesions before your next follow-up, please call to schedule a visit.  Please continue daily skin protection including broad spectrum sunscreen SPF 30+ to sun-exposed areas, reapplying every  2 hours as needed when you're outdoors.   Staying in the shade or wearing long sleeves, sun glasses (UVA+UVB protection) and wide brim hats (4-inch brim around the entire circumference of the hat) are also recommended for sun protection.      Due to recent changes in healthcare laws, you may see results of your pathology and/or laboratory studies on MyChart before the doctors have had a chance to review them. We understand that in some cases there may be results that are confusing or concerning to you. Please understand that not all results are received at the same time and often  the doctors may need to interpret multiple results in order to provide you with the best plan of care or course of treatment. Therefore, we ask that you please give us  2 business days to thoroughly review all your results before contacting the office for clarification. Should we see a critical lab result, you will be contacted sooner.   If You Need Anything After Your Visit  If you have any questions or concerns for your doctor, please call our main line at [phone] and press option 4 to reach your doctor's medical assistant. If no one answers, please leave a voicemail as directed and we will return your call as soon as possible. Messages left after 4 pm will be answered the following business day.   You may also send us  a message via MyChart. We typically respond to MyChart messages within 1-2 business days.  For prescription refills, please ask your pharmacy to contact our office. Our fax number is [phone].  If you have an urgent issue when the clinic is closed that cannot wait until the next business day, you can page your doctor at the number below.    Please note that while we do our best to be available for urgent issues outside of office hours, we are not available 24/7.   If you have an urgent issue and are unable to reach us , you may choose to seek medical care at your doctor's office, retail clinic, urgent care center, or emergency room.  If you have a medical emergency, please immediately call 911 or go to the emergency department.  Pager Numbers  - Dr. Hester: [phone]  - Dr. Jackquline: [phone]  - Dr. Claudene: [phone]   - Dr. Raymund: [phone]  In the event of inclement weather, please call our main line at [phone] for an update on the status of any delays or closures.  Dermatology Medication Tips: Please keep the boxes that topical medications come in in order to help keep track of the instructions about where and how to use these. Pharmacies  typically print the medication instructions only on the boxes and not directly on the medication tubes.   If your medication is too expensive, please contact our office at [phone] option 4 or send us  a message through MyChart.   We are unable to tell what your co-pay for medications will be in advance as this is different depending on your insurance coverage. However, we may be able to find a substitute medication at lower cost or fill out paperwork to get insurance to cover a needed medication.   If a prior authorization is required to get your medication covered by your insurance company, please allow us  1-2 business days to complete this process.  Drug prices often vary depending on where the prescription is filled and some pharmacies may offer cheaper prices.  The website www.goodrx.com contains coupons for medications through  different pharmacies. The prices here do not account for what the cost may be with help from insurance (it may be cheaper with your insurance), but the website can give you the price if you did not use any insurance.  - You can print the associated coupon and take it with your prescription to the pharmacy.  - You may also stop by our office during regular business hours and pick up a GoodRx coupon card.  - If you need your prescription sent electronically to a different pharmacy, notify our office through [facility] or by phone at [phone] option 4.     Si Usted Necesita Algo Despus de Su Visita  Tambin puede enviarnos un mensaje a travs de Clinical cytogeneticist. Por lo general respondemos a los mensajes de MyChart en el transcurso de 1 a 2 das hbiles.  Para renovar recetas, por favor pida a su farmacia que se ponga en contacto con nuestra oficina. Randi lakes de fax es [place] [phone].  Si tiene un asunto urgente cuando la clnica est cerrada y que no puede esperar hasta el siguiente da hbil, puede llamar/localizar a su doctor(a) al nmero que  aparece a continuacin.   Por favor, tenga en cuenta que aunque hacemos todo lo posible para estar disponibles para asuntos urgentes fuera del horario de [place], no estamos disponibles las 24 horas del da, los 7 [address]  [address] de la [place].   Si tiene un problema urgente y no puede comunicarse con nosotros, puede optar por buscar atencin mdica  en el consultorio de su doctor(a), en una clnica privada, en un centro de atencin urgente o en una sala de emergencias.  Si tiene Engineer, drilling, por favor llame inmediatamente al 911 o vaya a la sala de emergencias.  Nmeros de bper  - Dr. Hester: [phone]  - Dra. Jackquline: [phone]  - Dr. Claudene: [phone]  - Dra. Kitts: [phone]  En caso de inclemencias del [place], por favor llame a nuestra lnea principal al [phone] para una actualizacin sobre el estado de cualquier retraso o cierre.  Consejos para la medicacin en dermatologa: Por favor, guarde las cajas en las que vienen los medicamentos de uso tpico para ayudarle a seguir las instrucciones sobre dnde y cmo usarlos. Las farmacias generalmente imprimen las instrucciones del medicamento slo en las cajas y no directamente en los tubos del [place].   Si su medicamento es muy caro, por favor, pngase en contacto con landry rieger llamando al [phone] y presione la opcin 4 o envenos un mensaje a travs de Clinical cytogeneticist.   No podemos decirle cul ser su copago por los medicamentos por adelantado ya que esto es diferente dependiendo de la cobertura de su seguro. Sin embargo, es posible que podamos encontrar un medicamento sustituto a Audiological scientist un formulario para que el seguro cubra el medicamento que se considera necesario.   Si se requiere una autorizacin previa para que su compaa de seguros malta su medicamento, por favor permtanos de 1 a 2 das hbiles para completar este proceso.  Los precios de los medicamentos varan con frecuencia  dependiendo del Environmental consultant de dnde se surte la receta y alguna farmacias pueden ofrecer precios ms baratos.  El sitio web www.goodrx.com tiene cupones para medicamentos de Health and safety inspector. Los precios aqu no tienen en cuenta lo que podra costar con la ayuda del seguro (puede ser ms barato con su seguro), pero el sitio web puede darle el precio si no utiliz Tourist information centre manager.  - Puede imprimir el  cupn correspondiente y llevarlo con su receta a la farmacia.  - Tambin puede pasar por nuestra oficina durante el horario de atencin regular y Education officer, museum una tarjeta de cupones de GoodRx.  - Si necesita que su receta se enve electrnicamente a una farmacia diferente, informe a nuestra oficina a travs de MyChart de [place] o por telfono llamando al [phone] y presione la opcin 4.

## 2024-01-08 NOTE — Progress Notes (Unsigned)
 Follow-Up Visit   Subjective  Anthony Colon is a 73 y.o. male who presents for the following: Skin Cancer Screening and Full Body Skin Exam. Hx of BCC. Hx of dysplastic nevus. Hx of AKs.   The patient presents for Total-Body Skin Exam (TBSE) for skin cancer screening and mole check. The patient has spots, moles and lesions to be evaluated, some may be new or changing and the patient may have concern these could be cancer.  The following portions of the chart were reviewed this encounter and updated as appropriate: medications, allergies, medical history  Review of Systems:  No other skin or systemic complaints except as noted in HPI or Assessment and Plan.  Objective  Well appearing patient in no apparent distress; mood and affect are within normal limits.  A full examination was performed including scalp, head, eyes, ears, nose, lips, neck, chest, axillae, abdomen, back, buttocks, bilateral upper extremities, bilateral lower extremities, hands, feet, fingers, toes, fingernails, and toenails. All findings within normal limits unless otherwise noted below.   Relevant physical exam findings are noted in the Assessment and Plan.    L lat calf x2, B/L temples x5 (7) Erythematous keratotic or waxy stuck-on papule or plaque. Left Upper Back 0.4 cm irregular brown macule  Nose x2 (2) Erythematous thin papules/macules with gritty scale.   Assessment & Plan   SKIN CANCER SCREENING PERFORMED TODAY.  HISTORY OF BASAL CELL CARCINOMA OF THE SKIN. Right forehead. 08/02/2014. - No evidence of recurrence today - Recommend regular full body skin exams - Recommend daily broad spectrum sunscreen SPF 30+ to sun-exposed areas, reapply every 2 hours as needed.  - Call if any new or changing lesions are noted between office visits  HISTORY OF DYSPLASTIC NEVUS. Left posterior shoulder. Moderate atypia. 04/30/2013. No evidence of recurrence today Recommend regular full body skin exams Recommend  daily broad spectrum sunscreen SPF 30+ to sun-exposed areas, reapply every 2 hours as needed.  Call if any new or changing lesions are noted between office visits   ACTINIC DAMAGE - Chronic condition, secondary to cumulative UV/sun exposure - diffuse scaly erythematous macules with underlying dyspigmentation - Recommend daily broad spectrum sunscreen SPF 30+ to sun-exposed areas, reapply every 2 hours as needed.  - Staying in the shade or wearing long sleeves, sun glasses (UVA+UVB protection) and wide brim hats (4-inch brim around the entire circumference of the hat) are also recommended for sun protection.  - Call for new or changing lesions.  LENTIGINES, SEBORRHEIC KERATOSES, HEMANGIOMAS - Benign normal skin lesions - Benign-appearing - Call for any changes  MELANOCYTIC NEVI - Tan-brown and/or pink-flesh-colored symmetric macules and papules - Benign appearing on exam today - Observation - Call clinic for new or changing moles - Recommend daily use of broad spectrum spf 30+ sunscreen to sun-exposed areas.   EPIDERMAL INCLUSION CYST Exam: Subcutaneous nodule at left neck post auricula, 0.6 cm   Benign-appearing. Exam most consistent with an epidermal inclusion cyst. Discussed that a cyst is a benign growth that can grow over time and sometimes get irritated or inflamed. Recommend observation if it is not bothersome. Discussed option of surgical excision to remove it if it is growing, symptomatic, or other changes noted. Please call for new or changing lesions so they can be evaluated.  INFLAMED SEBORRHEIC KERATOSIS (7) L lat calf x2, B/L temples x5 (7) Symptomatic, irritating, patient would like treated. Destruction of lesion - L lat calf x2, B/L temples x5 (7) Complexity: simple   Destruction  method: cryotherapy   Informed consent: discussed and consent obtained   Timeout:  patient name, date of birth, surgical site, and procedure verified Lesion destroyed using liquid nitrogen:  Yes   Region frozen until ice ball extended beyond lesion: Yes   Outcome: patient tolerated procedure well with no complications   Post-procedure details: wound care instructions given   Additional details:  Prior to procedure, discussed risks of blister formation, small wound, skin dyspigmentation, or rare scar following cryotherapy. Recommend Vaseline ointment to treated areas while healing.   NEOPLASM OF SKIN Left Upper Back Epidermal / dermal shaving  Lesion diameter (cm):  0.4 Informed consent: discussed and consent obtained   Timeout: patient name, date of birth, surgical site, and procedure verified   Procedure prep:  Patient was prepped and draped in usual sterile fashion Prep type:  Isopropyl alcohol Anesthesia: the lesion was anesthetized in a standard fashion   Anesthetic:  1% lidocaine  w/ epinephrine  1-100,000 buffered w/ 8.4% NaHCO3 Instrument used: flexible razor blade   Hemostasis achieved with: pressure, aluminum chloride and electrodesiccation   Outcome: patient tolerated procedure well   Post-procedure details: sterile dressing applied and wound care instructions given   Dressing type: bandage and petrolatum    Specimen 1 - Surgical pathology Differential Diagnosis: R/O Dysplastic nevus  Check Margins: Yes AK (ACTINIC KERATOSIS) (2) Nose x2 (2) Actinic keratoses are precancerous spots that appear secondary to cumulative UV radiation exposure/sun exposure over time. They are chronic with expected duration over 1 year. A portion of actinic keratoses will progress to squamous cell carcinoma of the skin. It is not possible to reliably predict which spots will progress to skin cancer and so treatment is recommended to prevent development of skin cancer.  Recommend daily broad spectrum sunscreen SPF 30+ to sun-exposed areas, reapply every 2 hours as needed.  Recommend staying in the shade or wearing long sleeves, sun glasses (UVA+UVB protection) and wide brim hats (4-inch  brim around the entire circumference of the hat). Call for new or changing lesions. Destruction of lesion - Nose x2 (2) Complexity: simple   Destruction method: cryotherapy   Informed consent: discussed and consent obtained   Timeout:  patient name, date of birth, surgical site, and procedure verified Lesion destroyed using liquid nitrogen: Yes   Region frozen until ice ball extended beyond lesion: Yes   Outcome: patient tolerated procedure well with no complications   Post-procedure details: wound care instructions given   Additional details:  Prior to procedure, discussed risks of blister formation, small wound, skin dyspigmentation, or rare scar following cryotherapy. Recommend Vaseline ointment to treated areas while healing.   SKIN CANCER SCREENING   ACTINIC SKIN DAMAGE   CYST OF SKIN   LENTIGO   MELANOCYTIC NEVUS, UNSPECIFIED LOCATION   HISTORY OF BASAL CELL CARCINOMA   HISTORY OF DYSPLASTIC NEVUS   Return in about 1 year (around 01/07/2025) for TBSE, HxBCC, HxDN, HxAK.  I, Jill Parcell, CMA, am acting as scribe for Alm Rhyme, MD.   Documentation: I have reviewed the above documentation for accuracy and completeness, and I agree with the above.  Alm Rhyme, MD

## 2024-01-13 ENCOUNTER — Ambulatory Visit: Payer: Self-pay | Admitting: Dermatology

## 2024-01-13 LAB — SURGICAL PATHOLOGY

## 2024-01-14 ENCOUNTER — Encounter: Payer: Self-pay | Admitting: Dermatology

## 2024-01-14 NOTE — Telephone Encounter (Addendum)
 Patient returned call. Went over bx results with patient. He verbalized understanding and denied further questions. Will recheck at next follow up  ----- Message from Alm Rhyme sent at 01/13/2024  6:04 PM EST ----- FINAL DIAGNOSIS        1. Skin, left upper back :       DYSPLASTIC COMPOUND NEVUS WITH MODERATE ATYPIA, CLOSE TO MARGIN   Moderate Dysplastic Re-check next visit ----- Message ----- From: Interface, Lab In [facility] [place] Sent: 01/13/2024   5:39 PM EST To: Alm JAYSON Rhyme, MD

## 2024-01-14 NOTE — Telephone Encounter (Addendum)
 Tried calling patient. No answer. Lm for patient to return call   ----- Message from Alm Rhyme sent at 01/13/2024  6:04 PM EST ----- FINAL DIAGNOSIS        1. Skin, left upper back :       DYSPLASTIC COMPOUND NEVUS WITH MODERATE ATYPIA, CLOSE TO MARGIN   Moderate Dysplastic Re-check next visit ----- Message ----- From: Interface, Lab In [facility] [place] Sent: 01/13/2024   5:39 PM EST To: Alm JAYSON Rhyme, MD

## 2025-01-07 ENCOUNTER — Ambulatory Visit: Admitting: Dermatology
